# Patient Record
Sex: Female | Born: 1975 | Race: White | Hispanic: No | Marital: Married | Smoking: Never smoker
Health system: Southern US, Community
[De-identification: ages and names within clinical notes are randomized; demographics above are authoritative.]

## PROBLEM LIST (undated history)

## (undated) DIAGNOSIS — G43909 Migraine, unspecified, not intractable, without status migrainosus: Secondary | ICD-10-CM

## (undated) DIAGNOSIS — I10 Essential (primary) hypertension: Secondary | ICD-10-CM

## (undated) DIAGNOSIS — E785 Hyperlipidemia, unspecified: Secondary | ICD-10-CM

## (undated) DIAGNOSIS — J45909 Unspecified asthma, uncomplicated: Secondary | ICD-10-CM

## (undated) DIAGNOSIS — J309 Allergic rhinitis, unspecified: Secondary | ICD-10-CM

## (undated) HISTORY — DX: Allergic rhinitis, unspecified: J30.9

## (undated) HISTORY — DX: Essential (primary) hypertension: I10

## (undated) HISTORY — DX: Unspecified asthma, uncomplicated: J45.909

## (undated) HISTORY — DX: Migraine, unspecified, not intractable, without status migrainosus: G43.909

## (undated) HISTORY — DX: Hyperlipidemia, unspecified: E78.5

---

## 2007-03-15 ENCOUNTER — Ambulatory Visit: Payer: Self-pay | Admitting: Internal Medicine

## 2007-10-15 ENCOUNTER — Ambulatory Visit: Payer: Self-pay | Admitting: Internal Medicine

## 2008-02-08 ENCOUNTER — Ambulatory Visit: Payer: Self-pay | Admitting: Internal Medicine

## 2008-06-14 ENCOUNTER — Ambulatory Visit: Payer: Self-pay | Admitting: Internal Medicine

## 2009-10-01 IMAGING — CR DG CHEST 2V
1 series · 2 of 2 positions shown · non-contrast
Comparison: none

REASON FOR EXAM: cough
COMMENTS:

PROCEDURE:     DXR - DXR CHEST PA (OR AP) AND LATERAL  - February 08, 2008  [DATE]
RESULT:     Comparison is made to the prior exam 10/15/2007. The lung field
remains clear. The heart, mediastinal and osseous structures are normal in
appearance. The chest again appears to the mild hyperexpanded.

[Series 1: view not recorded · 0.17mm/px · 2 of 2 slices shown]
[im 1/2]
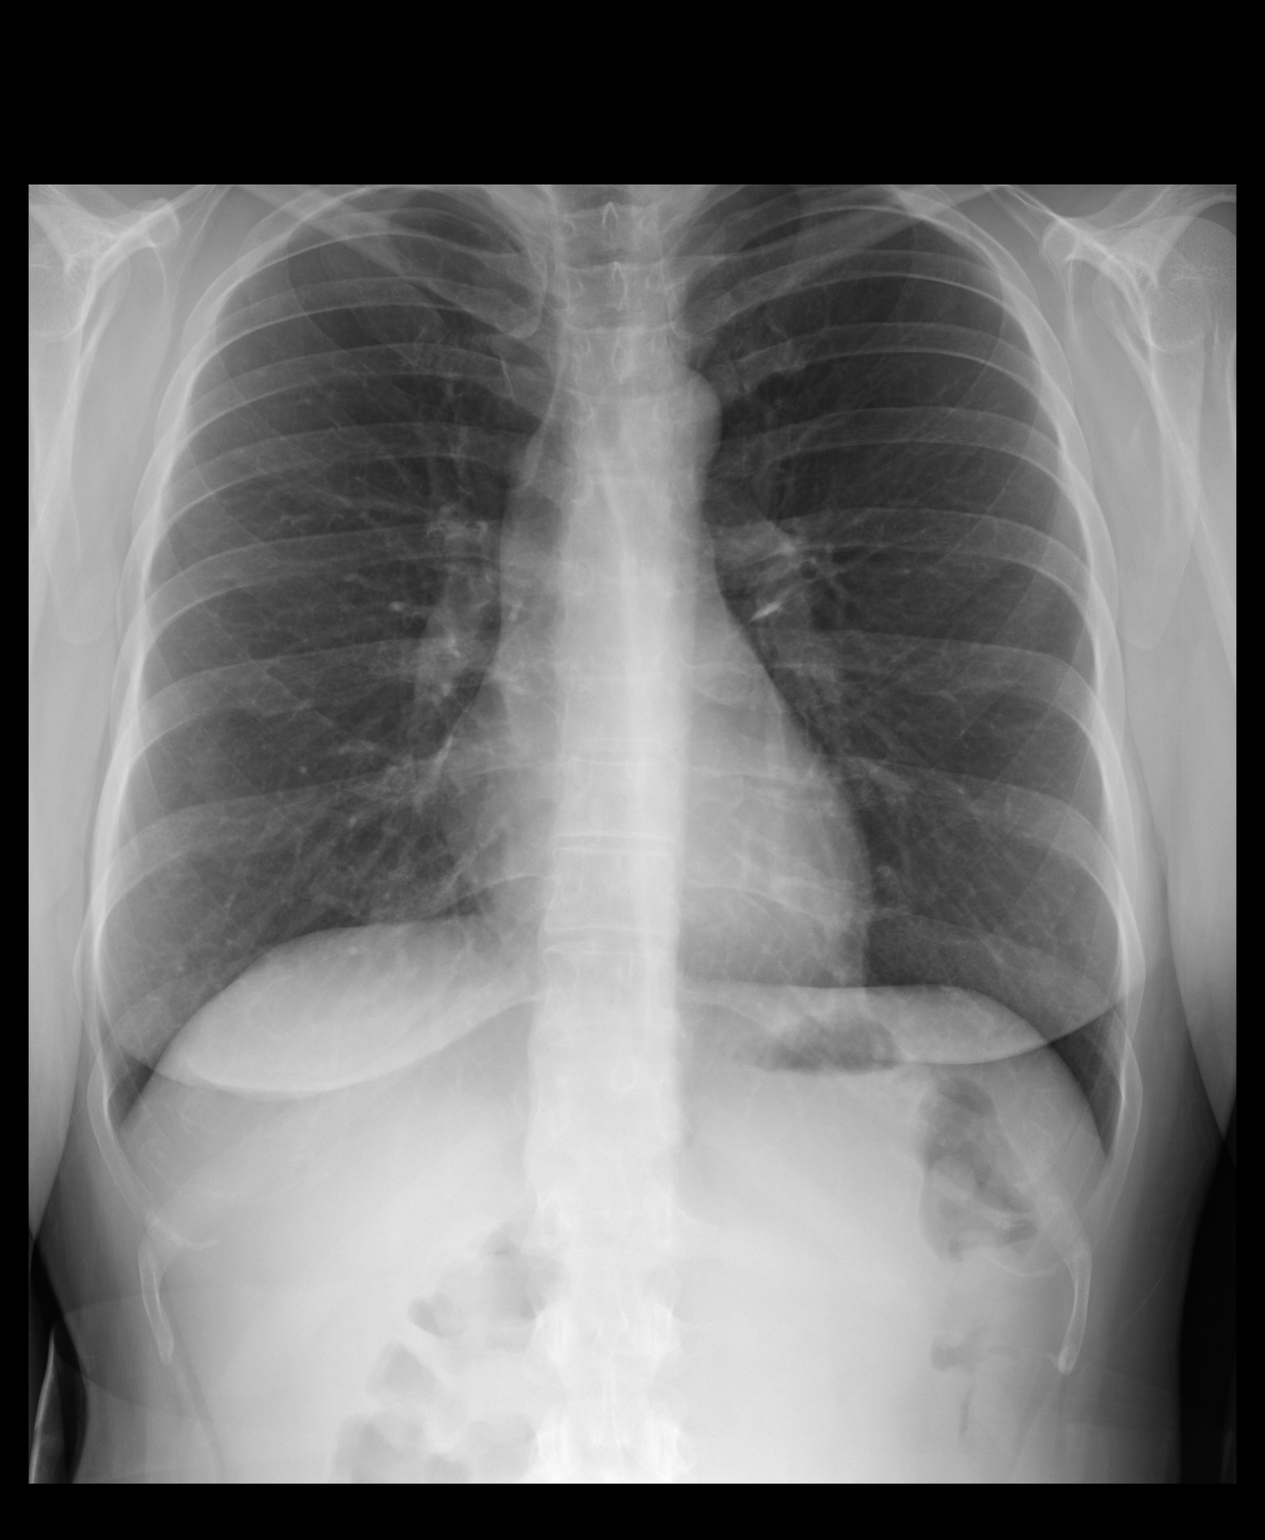
[im 2/2]
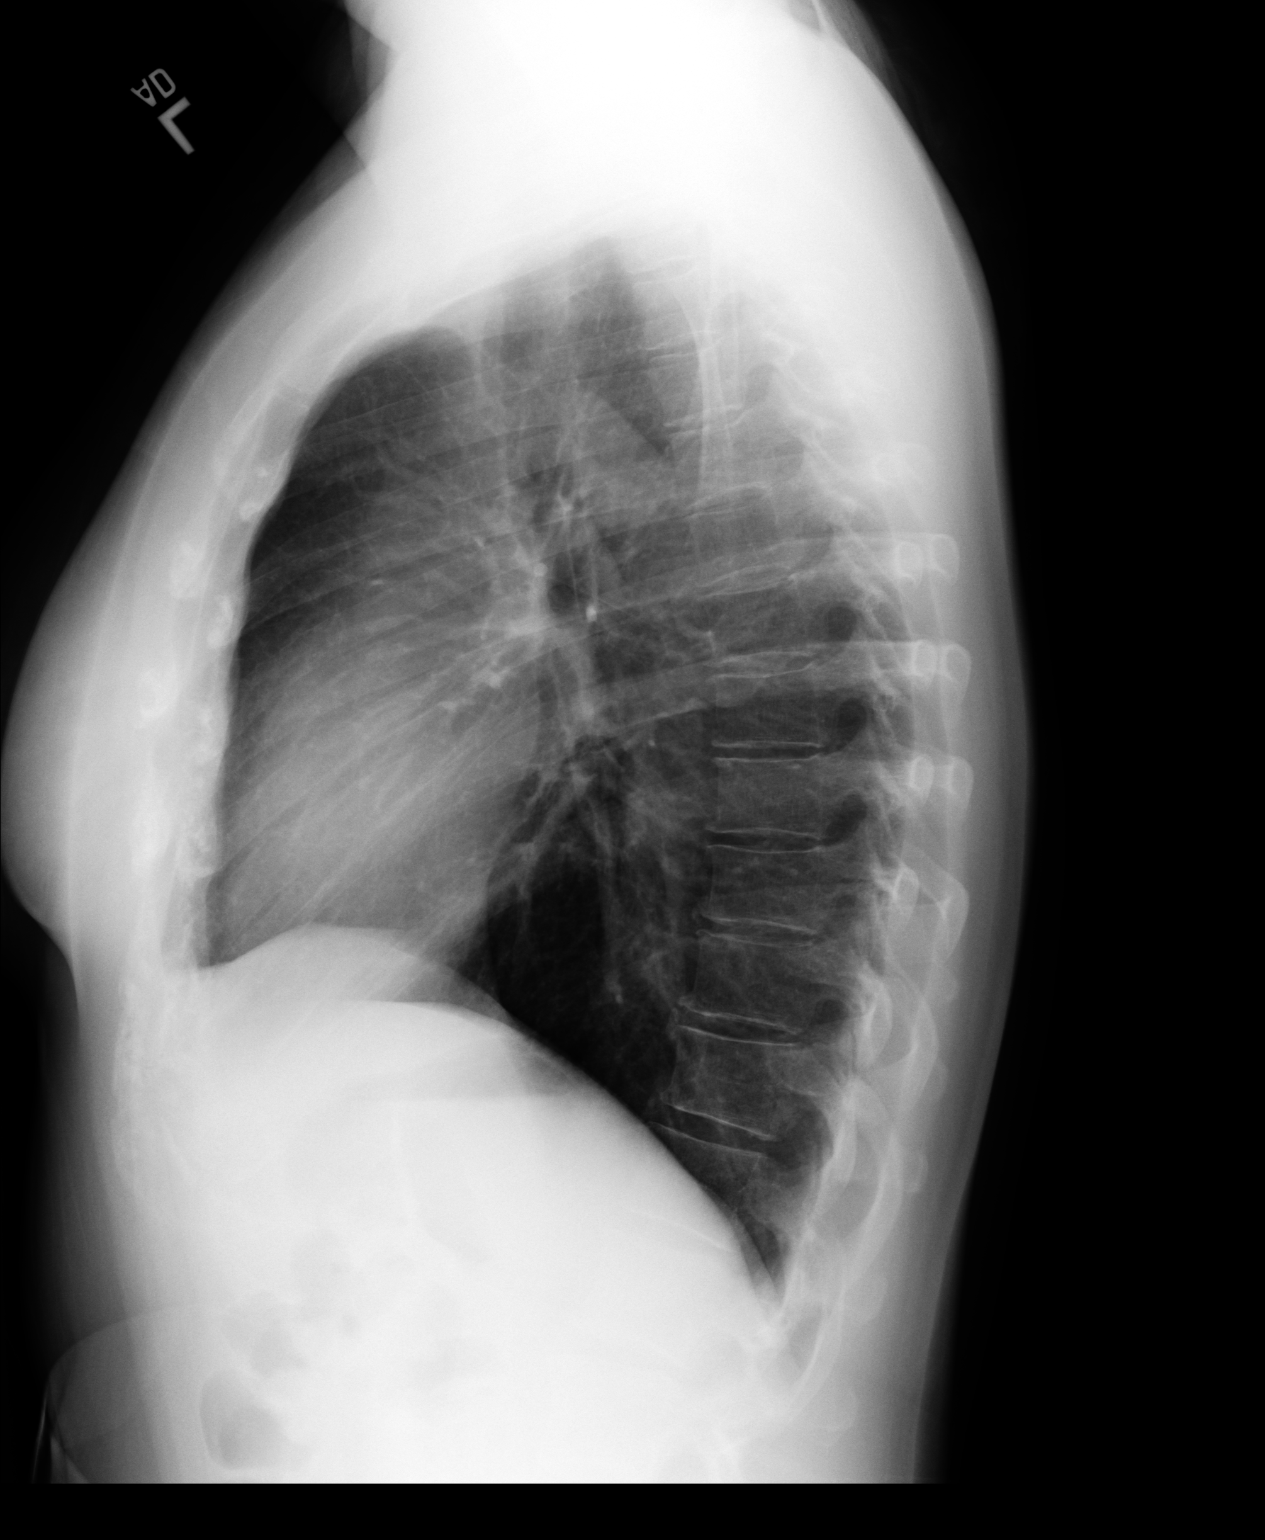

[2 of 2 positions shown; findings below may reference images not displayed]

IMPRESSION: 1. The lung fields are clear.
2. The chest appears mildly hyperexpanded.

## 2010-04-10 ENCOUNTER — Ambulatory Visit: Payer: Self-pay | Admitting: Internal Medicine

## 2010-04-24 ENCOUNTER — Ambulatory Visit: Payer: Self-pay | Admitting: Urology

## 2011-09-24 ENCOUNTER — Emergency Department: Payer: Self-pay | Admitting: *Deleted

## 2011-09-24 LAB — URINALYSIS, COMPLETE
Glucose,UR: NEGATIVE mg/dL (ref 0–75)
Ketone: NEGATIVE
Specific Gravity: 1.008 (ref 1.003–1.030)

## 2011-09-24 LAB — CBC
HGB: 12 g/dL (ref 12.0–16.0)
MCH: 30.8 pg (ref 26.0–34.0)
MCHC: 33.7 g/dL (ref 32.0–36.0)
MCV: 91 fL (ref 80–100)
Platelet: 248 10*3/uL (ref 150–440)
RDW: 14.1 % (ref 11.5–14.5)
WBC: 5.9 10*3/uL (ref 3.6–11.0)

## 2011-09-24 LAB — PREGNANCY, URINE: Pregnancy Test, Urine: NEGATIVE m[IU]/mL

## 2011-09-24 LAB — COMPREHENSIVE METABOLIC PANEL
Alkaline Phosphatase: 62 U/L (ref 50–136)
Anion Gap: 9 (ref 7–16)
Calcium, Total: 8.9 mg/dL (ref 8.5–10.1)
Chloride: 107 mmol/L (ref 98–107)
Co2: 26 mmol/L (ref 21–32)
EGFR (African American): >60
EGFR (Non-African Amer.): >60
Potassium: 3.6 mmol/L (ref 3.5–5.1)
Sodium: 142 mmol/L (ref 136–145)
Total Protein: 7.4 g/dL (ref 6.4–8.2)

## 2011-12-02 IMAGING — CR DG CHEST 2V
1 series · 2 of 2 positions shown · non-contrast
Comparison: none

REASON FOR EXAM: cough
COMMENTS:

PROCEDURE:     DXR - DXR CHEST PA (OR AP) AND LATERAL  - April 10, 2010 [DATE]
RESULT:     Comparison: None

[Series 1: view not recorded · 0.17mm/px · 2 of 2 slices shown]
[im 1/2]
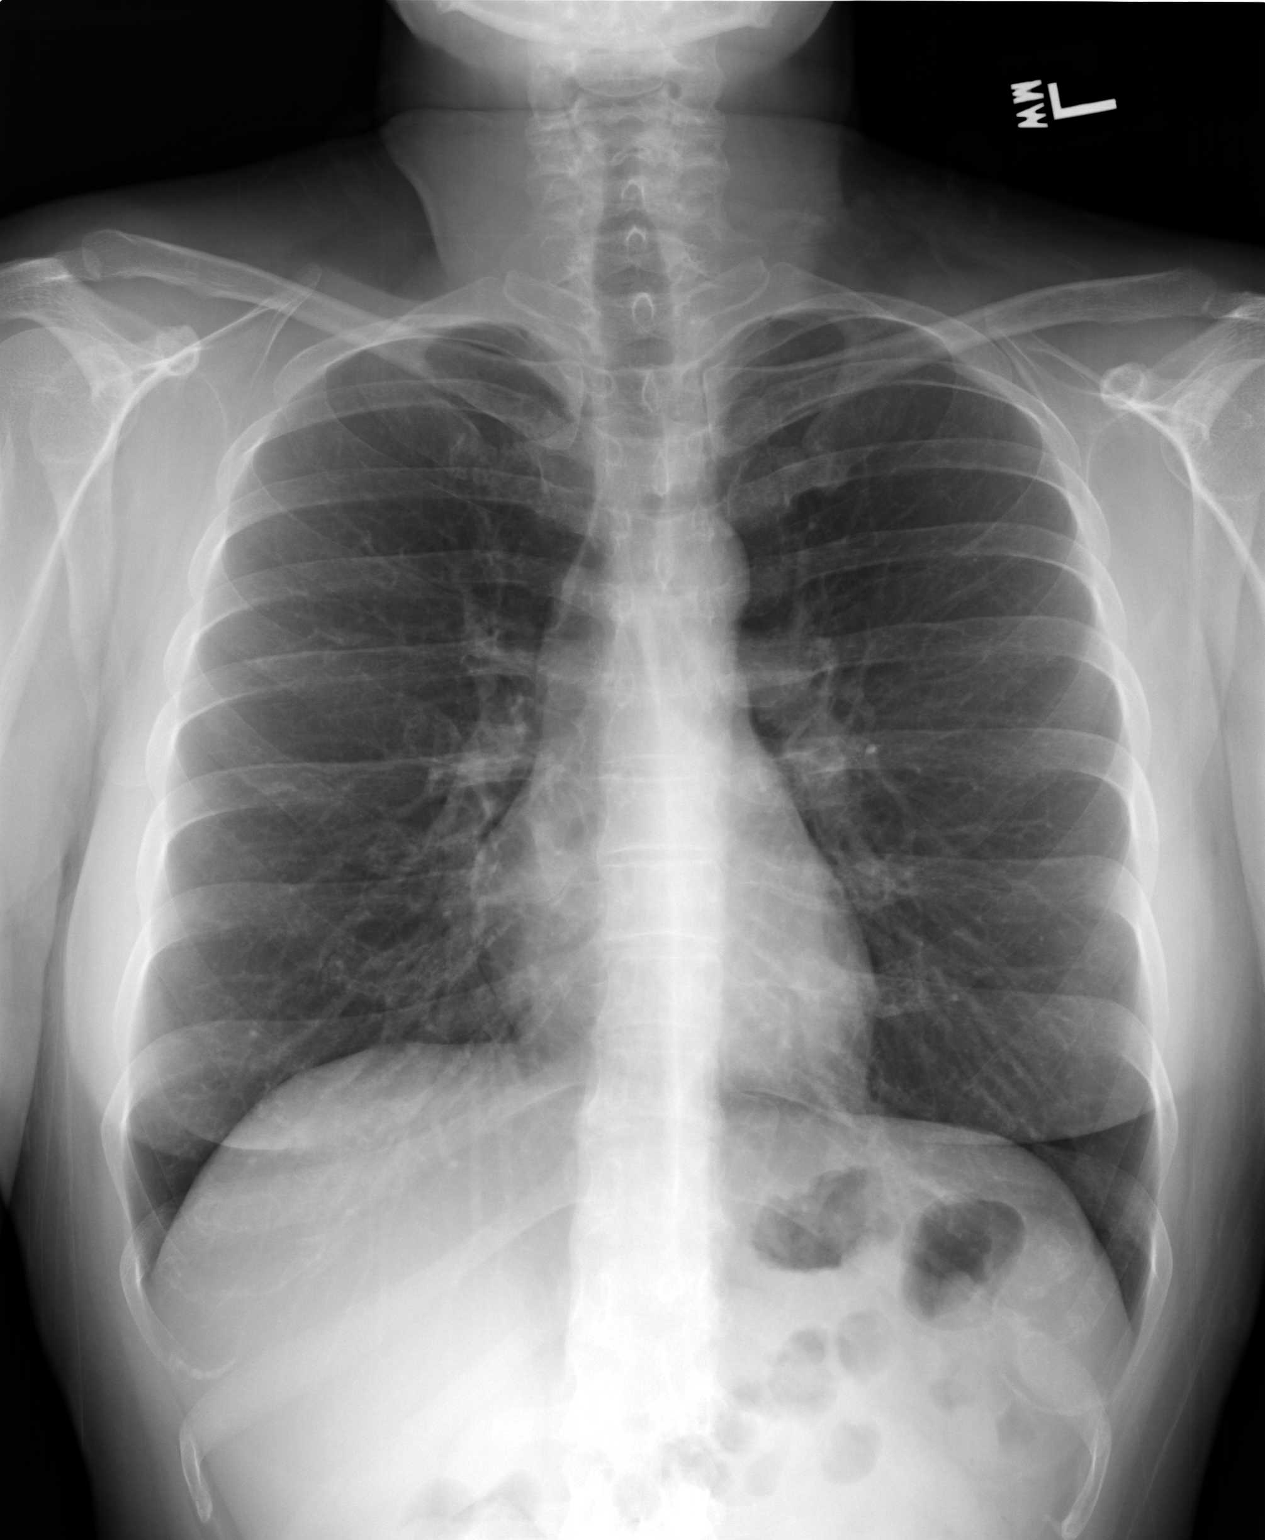
[im 2/2]
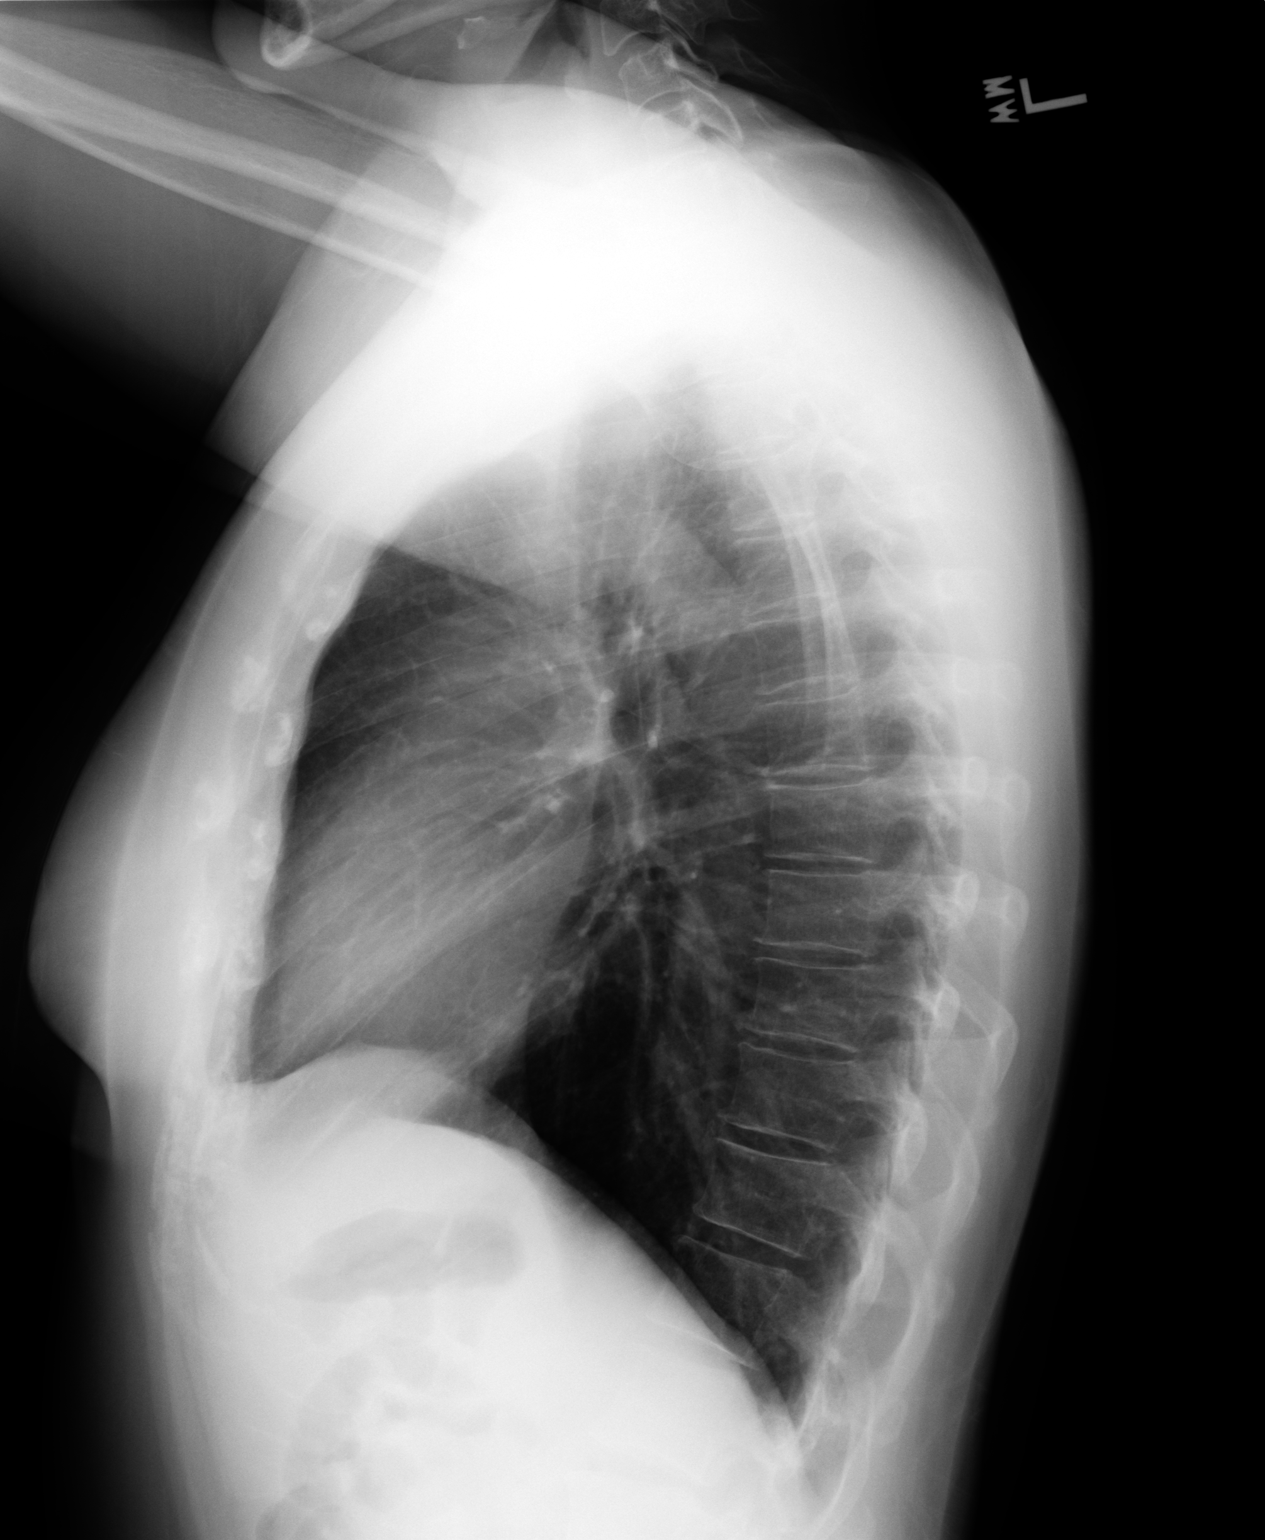

[2 of 2 positions shown; findings below may reference images not displayed]

FINDINGS: PA and lateral chest radiographs are provided.  There is no focal
parenchymal opacity, pleural effusion, or pneumothorax. The heart and
mediastinum are unremarkable. The osseous structures are unremarkable.
IMPRESSION: No acute disease of the chest.

## 2011-12-17 ENCOUNTER — Other Ambulatory Visit: Payer: Self-pay | Admitting: Obstetrics and Gynecology

## 2011-12-17 DIAGNOSIS — N6320 Unspecified lump in the left breast, unspecified quadrant: Secondary | ICD-10-CM

## 2011-12-23 ENCOUNTER — Other Ambulatory Visit: Payer: Self-pay | Admitting: Obstetrics and Gynecology

## 2011-12-23 ENCOUNTER — Ambulatory Visit
Admission: RE | Admit: 2011-12-23 | Discharge: 2011-12-23 | Disposition: A | Discharge status: Home / Self Care | Payer: BC Managed Care – PPO | Source: Ambulatory Visit | Attending: Obstetrics and Gynecology | Admitting: Obstetrics and Gynecology

## 2011-12-23 DIAGNOSIS — N6320 Unspecified lump in the left breast, unspecified quadrant: Secondary | ICD-10-CM

## 2014-04-12 ENCOUNTER — Ambulatory Visit: Payer: Self-pay

## 2017-04-03 ENCOUNTER — Other Ambulatory Visit: Payer: Self-pay

## 2017-04-07 ENCOUNTER — Other Ambulatory Visit: Payer: Self-pay

## 2017-04-09 ENCOUNTER — Other Ambulatory Visit: Payer: Self-pay

## 2017-04-09 MED ORDER — CYCLOBENZAPRINE HCL 10 MG PO TABS: 10.0000 mg | ORAL_TABLET | Freq: Three times a day (TID) | ORAL | 1 refills | Status: AC | PRN

## 2017-05-01 ENCOUNTER — Other Ambulatory Visit: Payer: Self-pay

## 2017-05-01 MED ORDER — IBUPROFEN 800 MG PO TABS: 800.0000 mg | ORAL_TABLET | Freq: Three times a day (TID) | ORAL | 1 refills | Status: DC | PRN

## 2017-05-18 ENCOUNTER — Other Ambulatory Visit: Payer: Self-pay

## 2017-05-18 MED ORDER — PROMETHAZINE HCL 25 MG PO TABS: ORAL_TABLET | ORAL | 1 refills | Status: DC

## 2017-06-19 ENCOUNTER — Other Ambulatory Visit: Payer: Self-pay | Admitting: Internal Medicine

## 2017-07-08 ENCOUNTER — Other Ambulatory Visit: Payer: Self-pay

## 2017-07-08 MED ORDER — IBUPROFEN 800 MG PO TABS: 800.0000 mg | ORAL_TABLET | Freq: Three times a day (TID) | ORAL | 1 refills | Status: DC | PRN

## 2017-07-19 ENCOUNTER — Other Ambulatory Visit: Payer: Self-pay | Admitting: Internal Medicine

## 2017-07-22 ENCOUNTER — Other Ambulatory Visit: Payer: Self-pay

## 2017-07-22 MED ORDER — MONTELUKAST SODIUM 10 MG PO TABS: 10.0000 mg | ORAL_TABLET | Freq: Every day | ORAL | 3 refills | Status: DC

## 2017-07-22 MED ORDER — PROMETHAZINE HCL 25 MG PO TABS: ORAL_TABLET | ORAL | 1 refills | Status: DC

## 2017-08-10 ENCOUNTER — Other Ambulatory Visit: Payer: Self-pay

## 2017-08-10 MED ORDER — IBUPROFEN 800 MG PO TABS: 800.0000 mg | ORAL_TABLET | Freq: Three times a day (TID) | ORAL | 1 refills | Status: DC | PRN

## 2017-08-11 ENCOUNTER — Other Ambulatory Visit: Payer: Self-pay

## 2017-08-11 MED ORDER — CYCLOBENZAPRINE HCL 10 MG PO TABS: 10.0000 mg | ORAL_TABLET | Freq: Three times a day (TID) | ORAL | 1 refills | Status: DC | PRN

## 2017-08-25 ENCOUNTER — Other Ambulatory Visit: Payer: Self-pay | Admitting: Internal Medicine

## 2017-09-21 ENCOUNTER — Other Ambulatory Visit: Payer: Self-pay

## 2017-09-21 ENCOUNTER — Telehealth: Payer: Self-pay

## 2017-09-21 MED ORDER — PROMETHAZINE HCL 25 MG PO TABS: ORAL_TABLET | ORAL | 1 refills | Status: DC

## 2017-09-21 NOTE — Telephone Encounter (Signed)
Try to call  Pt need appt for further voicemail full

## 2017-10-23 ENCOUNTER — Telehealth: Payer: Self-pay

## 2017-10-23 NOTE — Telephone Encounter (Signed)
PT MED REFILL WAS DENIED BECAUSE PT NEED APPT. PT WAS CALLED TO BE NOTIFIED SEVERAL TIMES.

## 2017-11-03 ENCOUNTER — Other Ambulatory Visit: Payer: Self-pay | Admitting: Internal Medicine

## 2017-11-04 ENCOUNTER — Ambulatory Visit: Payer: Self-pay | Admitting: Adult Health

## 2017-11-06 ENCOUNTER — Ambulatory Visit: Payer: Self-pay | Admitting: Adult Health

## 2017-11-09 ENCOUNTER — Encounter: Payer: Self-pay | Admitting: Nurse Practitioner

## 2017-11-09 ENCOUNTER — Ambulatory Visit: Payer: BC Managed Care – PPO | Admitting: Nurse Practitioner

## 2017-11-09 VITALS — BP 115/81 | HR 78 | Resp 16 | Ht 62.0 in | Wt 152.0 lb

## 2017-11-09 DIAGNOSIS — E876 Hypokalemia: Secondary | ICD-10-CM | POA: Diagnosis not present

## 2017-11-09 DIAGNOSIS — J4520 Mild intermittent asthma, uncomplicated: Secondary | ICD-10-CM | POA: Insufficient documentation

## 2017-11-09 DIAGNOSIS — J301 Allergic rhinitis due to pollen: Secondary | ICD-10-CM | POA: Diagnosis not present

## 2017-11-09 DIAGNOSIS — E782 Mixed hyperlipidemia: Secondary | ICD-10-CM | POA: Diagnosis not present

## 2017-11-09 DIAGNOSIS — I10 Essential (primary) hypertension: Secondary | ICD-10-CM

## 2017-11-09 DIAGNOSIS — G43909 Migraine, unspecified, not intractable, without status migrainosus: Secondary | ICD-10-CM | POA: Insufficient documentation

## 2017-11-09 DIAGNOSIS — R110 Nausea: Secondary | ICD-10-CM | POA: Diagnosis not present

## 2017-11-09 DIAGNOSIS — M791 Myalgia, unspecified site: Secondary | ICD-10-CM | POA: Diagnosis not present

## 2017-11-09 MED ORDER — IBUPROFEN 800 MG PO TABS: 800.0000 mg | ORAL_TABLET | Freq: Three times a day (TID) | ORAL | 1 refills | Status: DC | PRN

## 2017-11-09 MED ORDER — CYCLOBENZAPRINE HCL 10 MG PO TABS: 10.0000 mg | ORAL_TABLET | Freq: Three times a day (TID) | ORAL | 1 refills | Status: DC | PRN

## 2017-11-09 MED ORDER — LISINOPRIL 40 MG PO TABS: 40.0000 mg | ORAL_TABLET | Freq: Every day | ORAL | 5 refills | Status: DC

## 2017-11-09 MED ORDER — ALBUTEROL SULFATE HFA 108 (90 BASE) MCG/ACT IN AERS: 2.0000 | INHALATION_SPRAY | Freq: Four times a day (QID) | RESPIRATORY_TRACT | 5 refills | Status: DC | PRN

## 2017-11-09 MED ORDER — MONTELUKAST SODIUM 10 MG PO TABS: 10.0000 mg | ORAL_TABLET | Freq: Every day | ORAL | 3 refills | Status: DC

## 2017-11-09 MED ORDER — ATORVASTATIN CALCIUM 10 MG PO TABS: 10.0000 mg | ORAL_TABLET | ORAL | 5 refills | Status: DC

## 2017-11-09 MED ORDER — POTASSIUM CHLORIDE CRYS ER 10 MEQ PO TBCR: 10.0000 meq | EXTENDED_RELEASE_TABLET | Freq: Every day | ORAL | 5 refills | Status: DC

## 2017-11-09 MED ORDER — ZOLMITRIPTAN 5 MG PO TABS: 5.0000 mg | ORAL_TABLET | ORAL | 5 refills | Status: DC | PRN

## 2017-11-09 MED ORDER — PROMETHAZINE HCL 25 MG PO TABS: ORAL_TABLET | ORAL | 1 refills | Status: DC

## 2017-11-09 MED ORDER — MOMETASONE FUROATE 50 MCG/ACT NA SUSP: 2.0000 | Freq: Every day | NASAL | 5 refills | Status: DC

## 2017-11-09 NOTE — Progress Notes (Signed)
[facility] [address] [place], [place] [address]  Internal MEDICINE  Office Visit Note  Patient Name: Linda Rocha  [id number]  [id number]  Date of Service: 11/09/2017  Chief Complaint  Patient presents with  . Medication Refill  . Wheezing    couple of wks when the weather became hot  . Shortness of Breath    She continues to have migraine headaches. Will generally have 3 to 4 per week. Will take zomig to relieve the acute headache. This has continued to work well for her. She gets nauseated and has neck pain when she gets migraines. She often needs to take promethazine to relieve nausea and flexeril at night if she has neck pain and stiffness. She does need to have refills for these medications today.   Wheezing   This is a new problem. The current episode started more than 1 month ago. The problem occurs 2 to 4 times per day. The problem has been waxing and waning. Associated symptoms include headaches, neck pain and shortness of breath. Pertinent negatives include no abdominal pain, chest pain, chills, coughing, diarrhea, rash, rhinorrhea, sore throat or vomiting. The symptoms are aggravated by exercise and weather changes (hot and humid weather). She has tried leukotriene antagonists and rest for the symptoms. The treatment provided mild relief. Her past medical history is significant for asthma.       Current Medication: Outpatient Encounter Medications as of 11/09/2017  Medication Sig  . ALPRAZolam (XANAX) 1 MG tablet TK 1 T PO TID PRN  . amphetamine-dextroamphetamine (ADDERALL) 30 MG tablet TK 1 T PO BID  . atorvastatin (LIPITOR) 10 MG tablet Take 1 tablet (10 mg total) by mouth See admin instructions.  Marland Kitchen buPROPion (WELLBUTRIN SR) 100 MG 12 hr tablet Take 100 mg by mouth daily.  Marland Kitchen buPROPion (WELLBUTRIN SR) 150 MG 12 hr tablet Take 150 mg by mouth 2 (two) times daily.  . cyclobenzaprine (FLEXERIL) 10 MG tablet Take 1 tablet (10 mg total) by mouth 3 (three)  times daily as needed for muscle spasms.  Marland Kitchen ibuprofen (ADVIL,MOTRIN) 800 MG tablet 1 tablet (800 mg total) by Per NG tube route 3 (three) times daily as needed.  Marland Kitchen lisinopril (PRINIVIL,ZESTRIL) 40 MG tablet Take 1 tablet (40 mg total) by mouth daily.  . mometasone (NASONEX) 50 MCG/ACT nasal spray Place 2 sprays into the nose daily.  . montelukast (SINGULAIR) 10 MG tablet Take 1 tablet (10 mg total) by mouth daily.  . potassium chloride (KLOR-CON M10) 10 MEQ tablet Take 1 tablet (10 mEq total) by mouth daily.  . promethazine (PHENERGAN) 25 MG tablet Take 1 tablet by mouth as needed for nausea  . zolmitriptan (ZOMIG) 5 MG tablet Take 1 tablet (5 mg total) by mouth as needed for migraine.  . [DISCONTINUED] atorvastatin (LIPITOR) 10 MG tablet Take 10 mg by mouth See admin instructions.  . [DISCONTINUED] cyclobenzaprine (FLEXERIL) 10 MG tablet Take 1 tablet (10 mg total) by mouth 3 (three) times daily as needed for muscle spasms.  . [DISCONTINUED] ibuprofen (ADVIL,MOTRIN) 800 MG tablet 1 tablet (800 mg total) by Per NG tube route 3 (three) times daily as needed.  . [DISCONTINUED] KLOR-CON M10 10 MEQ tablet TAKE 1 TABLET BY MOUTH EVERY DAY  . [DISCONTINUED] lisinopril (PRINIVIL,ZESTRIL) 40 MG tablet TAKE 1 TABLET(S) BY MOUTH DAILY FOR HYPERTENSION  . [DISCONTINUED] mometasone (NASONEX) 50 MCG/ACT nasal spray SPRAY 2 SPRAYS IN EACH NOSTRIL DAILY FOR RHINITIS  . [DISCONTINUED] montelukast (SINGULAIR) 10 MG tablet Take  1 tablet (10 mg total) by mouth daily.  . [DISCONTINUED] promethazine (PHENERGAN) 25 MG tablet Take 1 tablet by mouth as needed for nausea  . [DISCONTINUED] zolmitriptan (ZOMIG) 5 MG tablet TAKE 1 TABLET BY MOUTH ONE TIME FOR MIGRAINE. MAY REPEAT DOSE IN 2 HOURS FOR PERSISTENT MIGRAINE.  Marland Kitchen albuterol (PROVENTIL HFA;VENTOLIN HFA) 108 (90 Base) MCG/ACT inhaler Inhale 2 puffs into the lungs every 6 (six) hours as needed for wheezing or shortness of breath.   No facility-administered encounter  medications on file as of 11/09/2017.     Surgical History: History reviewed. No pertinent surgical history.  Medical History: Past Medical History:  Diagnosis Date  . Allergic rhinitis   . Asthma   . Hyperlipidemia   . Hypertension   . Migraines     Family History: Family History  Problem Relation Age of Onset  . Alcohol abuse Mother   . Asthma Mother   . Coronary artery disease Mother   . Hypertension Mother   . Migraines Mother   . Alcohol abuse Father   . Asthma Father   . Hypertension Father   . Diabetes Maternal Grandmother   . Diabetes Maternal Grandfather   . Diabetes Paternal Grandmother   . Diabetes Paternal Grandfather     Social History   Socioeconomic History  . Marital status: Married    Spouse name: Not on file  . Number of children: Not on file  . Years of education: Not on file  . Highest education level: Not on file  Occupational History  . Not on file  Social Needs  . Financial resource strain: Not on file  . Food insecurity:    Worry: Not on file    Inability: Not on file  . Transportation needs:    Medical: Not on file    Non-medical: Not on file  Tobacco Use  . Smoking status: Never Smoker  . Smokeless tobacco: Never Used  Substance and Sexual Activity  . Alcohol use: Never    Frequency: Never  . Drug use: Not on file  . Sexual activity: Not on file  Lifestyle  . Physical activity:    Days per week: Not on file    Minutes per session: Not on file  . Stress: Not on file  Relationships  . Social connections:    Talks on phone: Not on file    Gets together: Not on file    Attends religious service: Not on file    Active member of club or organization: Not on file    Attends meetings of clubs or organizations: Not on file    Relationship status: Not on file  . Intimate partner violence:    Fear of current or ex partner: Not on file    Emotionally abused: Not on file    Physically abused: Not on file    Forced sexual  activity: Not on file  Other Topics Concern  . Not on file  Social History Narrative  . Not on file      Review of Systems  Constitutional: Negative for activity change, chills, fatigue and unexpected weight change.  HENT: Negative for congestion, postnasal drip, rhinorrhea, sneezing and sore throat.   Eyes: Negative.  Negative for redness.  Respiratory: Positive for shortness of breath and wheezing. Negative for cough and chest tightness.        Intermittent and worse with exercise and with hot and humid weather .  Cardiovascular: Negative for chest pain and palpitations.  Gastrointestinal: Negative  for abdominal pain, constipation, diarrhea, nausea and vomiting.  Endocrine: Negative for cold intolerance, heat intolerance, polydipsia, polyphagia and polyuria.  Genitourinary: Negative for dysuria and frequency.  Musculoskeletal: Positive for neck pain. Negative for arthralgias, back pain and joint swelling.  Skin: Negative for rash.  Allergic/Immunologic: Positive for environmental allergies.  Neurological: Positive for headaches. Negative for tremors and numbness.  Hematological: Negative for adenopathy. Does not bruise/bleed easily.  Psychiatric/Behavioral: Positive for dysphoric mood. Negative for behavioral problems (Depression), sleep disturbance and suicidal ideas. The patient is nervous/anxious.        Patient routinely ses psychiatry for anxiety and focus issues.     Today's Vitals   11/09/17 1042  BP: 115/81  Pulse: 78  Resp: 16  SpO2: 96%  Weight: 152 lb (68.9 kg)  Height: 5\' 2"  (1.575 m)    Physical Exam  Constitutional: She is oriented to person, place, and time. She appears well-developed and well-nourished. No distress.  HENT:  Head: Normocephalic and atraumatic.  Mouth/Throat: Oropharynx is clear and moist. No oropharyngeal exudate.  Eyes: Pupils are equal, round, and reactive to light. EOM are normal.  Neck: Normal range of motion. Neck supple. No  hepatojugular reflux and no JVD present. No tracheal deviation present. No thyromegaly present.  Cardiovascular: Normal rate, regular rhythm, normal heart sounds and intact distal pulses. Exam reveals no gallop and no friction rub.  No murmur heard. Pulmonary/Chest: Effort normal and breath sounds normal. No respiratory distress. She has no wheezes. She has no rales. She exhibits no tenderness.  Abdominal: Soft. Bowel sounds are normal. There is no tenderness.  Musculoskeletal: Normal range of motion.  Lymphadenopathy:    She has no cervical adenopathy.  Neurological: She is alert and oriented to person, place, and time. No cranial nerve deficit.  Skin: Skin is warm and dry. She is not diaphoretic.  Psychiatric: Her speech is normal and behavior is normal. Judgment and thought content normal. Her mood appears anxious.  Nursing note and vitals reviewed.  Assessment/Plan:  1. Mild intermittent asthma without complication Start albuterol inhaler. Use 2 puffs every 6 hours as needed. If persistent, will get PFT and echocardiogram.  - albuterol (PROVENTIL HFA;VENTOLIN HFA) 108 (90 Base) MCG/ACT inhaler; Inhale 2 puffs into the lungs every 6 (six) hours as needed for wheezing or shortness of breath.  Dispense: 1 Inhaler; Refill: 5  2. Seasonal allergic rhinitis due to pollen Continue singulair and nasonex nasal spray as prescribed. Refills provided today.  - montelukast (SINGULAIR) 10 MG tablet; Take 1 tablet (10 mg total) by mouth daily.  Dispense: 30 tablet; Refill: 3 - mometasone (NASONEX) 50 MCG/ACT nasal spray; Place 2 sprays into the nose daily.  Dispense: 17 g; Refill: 5  3. Migraine without status migrainosus, not intractable, unspecified migraine type - cyclobenzaprine (FLEXERIL) 10 MG tablet; Take 1 tablet (10 mg total) by mouth 3 (three) times daily as needed for muscle spasms.  Dispense: 90 tablet; Refill: 1 - ibuprofen (ADVIL,MOTRIN) 800 MG tablet; 1 tablet (800 mg total) by Per NG  tube route 3 (three) times daily as needed.  Dispense: 30 tablet; Refill: 1 - zolmitriptan (ZOMIG) 5 MG tablet; Take 1 tablet (5 mg total) by mouth as needed for migraine.  Dispense: 16 tablet; Refill: 5 - promethazine (PHENERGAN) 25 MG tablet; Take 1 tablet by mouth as needed for nausea  Dispense: 30 tablet; Refill: 1  4. Essential hypertension - lisinopril (PRINIVIL,ZESTRIL) 40 MG tablet; Take 1 tablet (40 mg total) by mouth daily.  Dispense:  30 tablet; Refill: 5  5. Myalgia - cyclobenzaprine (FLEXERIL) 10 MG tablet; Take 1 tablet (10 mg total) by mouth 3 (three) times daily as needed for muscle spasms.  Dispense: 90 tablet; Refill: 1  6. Mixed hyperlipidemia - atorvastatin (LIPITOR) 10 MG tablet; Take 1 tablet (10 mg total) by mouth See admin instructions.  Dispense: 30 tablet; Refill: 5  7. Nausea - promethazine (PHENERGAN) 25 MG tablet; Take 1 tablet by mouth as needed for nausea  Dispense: 30 tablet; Refill: 1  8. Hypokalemia - potassium chloride (KLOR-CON M10) 10 MEQ tablet; Take 1 tablet (10 mEq total) by mouth daily.  Dispense: 30 tablet; Refill: 5   General Counseling: Linda Rocha verbalizes understanding of the findings of todays visit and agrees with plan of treatment. I have discussed any further diagnostic evaluation that may be needed or ordered today. We also reviewed her medications today. she has been encouraged to call the office with any questions or concerns that should arise related to todays visit.  This patient was seen by Kholton Coate FNP Collaboration with Dr Lyndon CodeFozia M Khan as a part of [facility] care agreement   Meds ordered this encounter  Medications  . atorvastatin (LIPITOR) 10 MG tablet    Sig: Take 1 tablet (10 mg total) by mouth See admin instructions.    Dispense:  30 tablet    Refill:  5    Order Specific Question:   Supervising Provider    Answer:   Lyndon CodeKHAN, FOZIA M [[id number]]  . cyclobenzaprine (FLEXERIL) 10 MG tablet    Sig: Take 1 tablet (10 mg total) by  mouth 3 (three) times daily as needed for muscle spasms.    Dispense:  90 tablet    Refill:  1    Order Specific Question:   Supervising Provider    Answer:   Lyndon CodeKHAN, FOZIA M [[id number]]  . ibuprofen (ADVIL,MOTRIN) 800 MG tablet    Sig: 1 tablet (800 mg total) by Per NG tube route 3 (three) times daily as needed.    Dispense:  30 tablet    Refill:  1    Order Specific Question:   Supervising Provider    Answer:   Lyndon CodeKHAN, FOZIA M [[id number]]  . lisinopril (PRINIVIL,ZESTRIL) 40 MG tablet    Sig: Take 1 tablet (40 mg total) by mouth daily.    Dispense:  30 tablet    Refill:  5    Order Specific Question:   Supervising Provider    Answer:   Lyndon CodeKHAN, FOZIA M [[id number]]  . montelukast (SINGULAIR) 10 MG tablet    Sig: Take 1 tablet (10 mg total) by mouth daily.    Dispense:  30 tablet    Refill:  3    Order Specific Question:   Supervising Provider    Answer:   Lyndon CodeKHAN, FOZIA M [[id number]]  . zolmitriptan (ZOMIG) 5 MG tablet    Sig: Take 1 tablet (5 mg total) by mouth as needed for migraine.    Dispense:  16 tablet    Refill:  5    Order Specific Question:   Supervising Provider    Answer:   Lyndon CodeKHAN, FOZIA M [[id number]]  . mometasone (NASONEX) 50 MCG/ACT nasal spray    Sig: Place 2 sprays into the nose daily.    Dispense:  17 g    Refill:  5    Order Specific Question:   Supervising Provider    Answer:   Lyndon CodeKHAN, FOZIA M [[id number]]  . albuterol (PROVENTIL HFA;VENTOLIN HFA) 108 (  90 Base) MCG/ACT inhaler    Sig: Inhale 2 puffs into the lungs every 6 (six) hours as needed for wheezing or shortness of breath.    Dispense:  1 Inhaler    Refill:  5    Order Specific Question:   Supervising Provider    Answer:   Lyndon Code [[id number]]  . promethazine (PHENERGAN) 25 MG tablet    Sig: Take 1 tablet by mouth as needed for nausea    Dispense:  30 tablet    Refill:  1    Pt need appt for further refills    Order Specific Question:   Supervising Provider    Answer:   Lyndon Code [[id number]]  . potassium chloride (KLOR-CON M10) 10 MEQ  tablet    Sig: Take 1 tablet (10 mEq total) by mouth daily.    Dispense:  30 tablet    Refill:  5    Order Specific Question:   Supervising Provider    Answer:   Lyndon Code [[id number]]    Time spent: 96 Minutes      Dr Lyndon Code Internal medicine

## 2017-12-07 ENCOUNTER — Other Ambulatory Visit: Payer: Self-pay

## 2017-12-07 DIAGNOSIS — E876 Hypokalemia: Secondary | ICD-10-CM

## 2017-12-07 DIAGNOSIS — G43909 Migraine, unspecified, not intractable, without status migrainosus: Secondary | ICD-10-CM

## 2017-12-07 MED ORDER — POTASSIUM CHLORIDE CRYS ER 10 MEQ PO TBCR: 10.0000 meq | EXTENDED_RELEASE_TABLET | Freq: Every day | ORAL | 5 refills | Status: DC

## 2017-12-07 MED ORDER — IBUPROFEN 800 MG PO TABS: 800.0000 mg | ORAL_TABLET | Freq: Three times a day (TID) | ORAL | 1 refills | Status: DC | PRN

## 2017-12-11 ENCOUNTER — Encounter: Payer: Self-pay | Admitting: Nurse Practitioner

## 2017-12-11 ENCOUNTER — Telehealth: Payer: Self-pay

## 2017-12-11 ENCOUNTER — Ambulatory Visit: Payer: BC Managed Care – PPO | Admitting: Nurse Practitioner

## 2017-12-11 VITALS — BP 121/77 | HR 98 | Resp 16 | Ht 61.0 in | Wt 149.4 lb

## 2017-12-11 DIAGNOSIS — G43909 Migraine, unspecified, not intractable, without status migrainosus: Secondary | ICD-10-CM

## 2017-12-11 DIAGNOSIS — J0100 Acute maxillary sinusitis, unspecified: Secondary | ICD-10-CM

## 2017-12-11 DIAGNOSIS — J301 Allergic rhinitis due to pollen: Secondary | ICD-10-CM

## 2017-12-11 DIAGNOSIS — R110 Nausea: Secondary | ICD-10-CM

## 2017-12-11 DIAGNOSIS — J4520 Mild intermittent asthma, uncomplicated: Secondary | ICD-10-CM

## 2017-12-11 MED ORDER — CLARITHROMYCIN 500 MG PO TABS: 500.0000 mg | ORAL_TABLET | Freq: Two times a day (BID) | ORAL | 1 refills | Status: DC

## 2017-12-11 MED ORDER — PROMETHAZINE HCL 25 MG PO TABS
ORAL_TABLET | ORAL | 1 refills | Status: DC
Start: 2017-12-11 — End: 2018-02-01

## 2017-12-11 MED ORDER — CLARITHROMYCIN ER 500 MG PO TB24: 1000.0000 mg | ORAL_TABLET | Freq: Two times a day (BID) | ORAL | 0 refills | Status: DC

## 2017-12-11 MED ORDER — GALCANEZUMAB-GNLM 120 MG/ML ~~LOC~~ SOSY: 120.0000 mg | PREFILLED_SYRINGE | SUBCUTANEOUS | 5 refills | Status: DC

## 2017-12-11 MED ORDER — ZOLMITRIPTAN 5 MG NA SOLN: 1.0000 | NASAL | 0 refills | Status: DC | PRN

## 2017-12-11 NOTE — Progress Notes (Signed)
[facility] [address] [place], [place] [address]  Internal MEDICINE  Office Visit Note  Patient Name: Linda Rocha  [id number]  [id number]  Date of Service: 12/21/2017   Pt is here for a sick visit.  Chief Complaint  Patient presents with  . Sinusitis    yesterday got sick at school, vomiting and headache, took migraine medication it didnt help. pressure between eye and nasal area, teeth ache. has not vomited since last night has not eaten anything as well, past couple of night woke up with night sweats.  . Migraine     Sinusitis  This is a new problem. The current episode started yesterday. The problem has been gradually worsening since onset. There has been no fever. Her pain is at a severity of 2/10. The pain is mild. Associated symptoms include chills, congestion, headaches, neck pain, sinus pressure and a sore throat. Pertinent negatives include no coughing, ear pain, shortness of breath, sneezing or swollen glands. Past treatments include acetaminophen and oral decongestants. The treatment provided mild relief.  Migraine   This is a chronic problem. The current episode started more than 1 year ago. The problem occurs daily. The problem has been waxing and waning. The pain radiates to the left neck and right neck. The pain quality is similar to prior headaches. The quality of the pain is described as aching and sharp. The pain is at a severity of 5/10. The pain is moderate. Associated symptoms include nausea, neck pain, phonophobia, rhinorrhea, sinus pressure, a sore throat and vomiting. Pertinent negatives include no abdominal pain, back pain, coughing, ear pain, eye redness, fever, numbness or swollen glands. The symptoms are aggravated by noise, activity and emotional stress. She has tried antidepressants, beta blockers, triptans, NSAIDs, darkened room and cold packs for the symptoms. The treatment provided moderate relief. Her past medical history is significant  for migraine headaches and migraines in the family.        Current Medication:  Outpatient Encounter Medications as of 12/11/2017  Medication Sig  . albuterol (PROVENTIL HFA;VENTOLIN HFA) 108 (90 Base) MCG/ACT inhaler Inhale 2 puffs into the lungs every 6 (six) hours as needed for wheezing or shortness of breath.  Marland Kitchen atorvastatin (LIPITOR) 10 MG tablet Take 1 tablet (10 mg total) by mouth See admin instructions.  Marland Kitchen buPROPion (WELLBUTRIN SR) 100 MG 12 hr tablet Take 100 mg by mouth daily.  Marland Kitchen buPROPion (WELLBUTRIN SR) 150 MG 12 hr tablet Take 150 mg by mouth 2 (two) times daily.  . cyclobenzaprine (FLEXERIL) 10 MG tablet Take 1 tablet (10 mg total) by mouth 3 (three) times daily as needed for muscle spasms.  Marland Kitchen ibuprofen (ADVIL,MOTRIN) 800 MG tablet 1 tablet (800 mg total) by Per NG tube route 3 (three) times daily as needed.  Marland Kitchen lisinopril (PRINIVIL,ZESTRIL) 40 MG tablet Take 1 tablet (40 mg total) by mouth daily.  . mometasone (NASONEX) 50 MCG/ACT nasal spray Place 2 sprays into the nose daily.  . montelukast (SINGULAIR) 10 MG tablet Take 1 tablet (10 mg total) by mouth daily.  . potassium chloride (KLOR-CON M10) 10 MEQ tablet Take 1 tablet (10 mEq total) by mouth daily.  . promethazine (PHENERGAN) 25 MG tablet Take 1 tablet by mouth as needed for nausea  . zolmitriptan (ZOMIG) 5 MG tablet Take 1 tablet (5 mg total) by mouth as needed for migraine.  . [DISCONTINUED] promethazine (PHENERGAN) 25 MG tablet Take 1 tablet by mouth as needed for nausea  . ALPRAZolam Prudy Feeler) 1  MG tablet TK 1 T PO TID PRN  . amphetamine-dextroamphetamine (ADDERALL) 30 MG tablet TK 1 T PO BID  . clarithromycin (BIAXIN XL) 500 MG 24 hr tablet Take 2 tablets (1,000 mg total) by mouth 2 (two) times daily.  . Galcanezumab-gnlm (EMGALITY) 120 MG/ML SOSY Inject 120 mg into the skin every 30 (thirty) days.  Marland Kitchen zolmitriptan (ZOMIG) 5 MG nasal solution Place 1 spray into the nose as needed for migraine.  . [DISCONTINUED]  clarithromycin (BIAXIN) 500 MG tablet Take 1 tablet (500 mg total) by mouth 2 (two) times daily.   No facility-administered encounter medications on file as of 12/11/2017.       Medical History: Past Medical History:  Diagnosis Date  . Allergic rhinitis   . Asthma   . Hyperlipidemia   . Hypertension   . Migraines      Vital Signs: BP 121/77 (BP Location: Right Arm, Patient Position: Sitting, Cuff Size: Normal)   Pulse 98   Resp 16   Ht 5\' 1"  (1.549 m)   Wt 149 lb 6.4 oz (67.8 kg)   SpO2 98%   BMI 28.23 kg/m    Review of Systems  Constitutional: Positive for chills and fatigue. Negative for activity change, fever and unexpected weight change.  HENT: Positive for congestion, postnasal drip, rhinorrhea, sinus pressure, sinus pain and sore throat. Negative for ear pain and sneezing.   Eyes: Negative.  Negative for redness.  Respiratory: Negative for cough, chest tightness, shortness of breath and wheezing.   Cardiovascular: Negative for chest pain and palpitations.  Gastrointestinal: Positive for nausea and vomiting. Negative for abdominal pain, constipation and diarrhea.  Endocrine: Negative for cold intolerance, heat intolerance, polydipsia, polyphagia and polyuria.  Genitourinary: Negative.  Negative for dysuria and frequency.  Musculoskeletal: Positive for myalgias and neck pain. Negative for arthralgias, back pain and joint swelling.  Skin: Negative for rash.  Allergic/Immunologic: Positive for environmental allergies.  Neurological: Positive for headaches. Negative for tremors and numbness.  Hematological: Positive for adenopathy. Does not bruise/bleed easily.  Psychiatric/Behavioral: Positive for dysphoric mood. Negative for behavioral problems (Depression), sleep disturbance and suicidal ideas. The patient is nervous/anxious.        Patient routinely ses psychiatry for anxiety and focus issues.     Physical Exam  Constitutional: She is oriented to person, place, and  time. She appears well-developed and well-nourished. No distress.  HENT:  Head: Normocephalic and atraumatic.  Right Ear: Tympanic membrane is erythematous and bulging.  Left Ear: Tympanic membrane is erythematous and bulging.  Nose: Rhinorrhea present. Right sinus exhibits maxillary sinus tenderness and frontal sinus tenderness. Left sinus exhibits maxillary sinus tenderness and frontal sinus tenderness.  Mouth/Throat: Oropharynx is clear and moist. No oropharyngeal exudate.  Eyes: Pupils are equal, round, and reactive to light. EOM are normal.  Neck: Normal range of motion. Neck supple. No hepatojugular reflux and no JVD present. No tracheal deviation present. No thyromegaly present.  Cardiovascular: Normal rate, regular rhythm and normal heart sounds. Exam reveals no gallop and no friction rub.  No murmur heard. Pulmonary/Chest: Effort normal and breath sounds normal. No respiratory distress. She has no wheezes. She has no rales. She exhibits no tenderness.  Abdominal: Soft. Bowel sounds are normal. There is no tenderness.  Musculoskeletal: Normal range of motion.  Lymphadenopathy:    She has cervical adenopathy.  Neurological: She is alert and oriented to person, place, and time. No cranial nerve deficit.  Skin: Skin is warm and dry. She is not diaphoretic.  Psychiatric: Her speech is normal and behavior is normal. Judgment and thought content normal. Her mood appears anxious.  Nursing note and vitals reviewed.  Assessment/Plan: 1. Acute maxillary sinusitis, recurrence not specified Start biaxin XL twice daily for 14 days. Rest and increase fluids. Use OTC medication to relieve symptoms.  - clarithromycin (BIAXIN XL) 500 MG 24 hr tablet; Take 2 tablets (1,000 mg total) by mouth 2 (two) times daily.  Dispense: 28 tablet; Refill: 0  2. Migraine without status migrainosus, not intractable, unspecified migraine type Will start emgality 120mg  - instructions for loading dose and samples were  provided. May continue to use abortive therapy as needed and as prescribed. Will give trial of zomig nasal spray. Sample provided.  - promethazine (PHENERGAN) 25 MG tablet; Take 1 tablet by mouth as needed for nausea  Dispense: 30 tablet; Refill: 1 - Galcanezumab-gnlm (EMGALITY) 120 MG/ML SOSY; Inject 120 mg into the skin every 30 (thirty) days.  Dispense: 1 Syringe; Refill: 5 - zolmitriptan (ZOMIG) 5 MG nasal solution; Place 1 spray into the nose as needed for migraine.  Dispense: 6 Units; Refill: 0  3. Nausea May use promethazine as needed and as prescribed for acute nausea.  - promethazine (PHENERGAN) 25 MG tablet; Take 1 tablet by mouth as needed for nausea  Dispense: 30 tablet; Refill: 1  4. Mild intermittent asthma without complication Use inhalers as needed and as prescribed.  5. Seasonal allergic rhinitis due to pollen Continue all allergy medication as prescribed.  General Counseling: Cecille AmsterdamAnn verbalizes understanding of the findings of todays visit and agrees with plan of treatment. I have discussed any further diagnostic evaluation that may be needed or ordered today. We also reviewed her medications today. she has been encouraged to call the office with any questions or concerns that should arise related to todays visit.    Counseling:  Rest and increase fluids. Continue using OTC medication to control symptoms.   This patient was seen by Vincent GrosHeather Elizaveta Mattice FNP Collaboration with Dr Lyndon CodeFozia M Khan as a part of collaborative care agreement    Meds ordered this encounter  Medications  . DISCONTD: clarithromycin (BIAXIN) 500 MG tablet    Sig: Take 1 tablet (500 mg total) by mouth 2 (two) times daily.    Dispense:  20 tablet    Refill:  1    Order Specific Question:   Supervising Provider    Answer:   Lyndon CodeKHAN, FOZIA M [[id number]]  . promethazine (PHENERGAN) 25 MG tablet    Sig: Take 1 tablet by mouth as needed for nausea    Dispense:  30 tablet    Refill:  1    Pt need appt for further  refills    Order Specific Question:   Supervising Provider    Answer:   Lyndon CodeKHAN, FOZIA M [[id number]]  . Galcanezumab-gnlm (EMGALITY) 120 MG/ML SOSY    Sig: Inject 120 mg into the skin every 30 (thirty) days.    Dispense:  1 Syringe    Refill:  5    Patient given sample for loading dose today as well as copay assistance card.    Order Specific Question:   Supervising Provider    Answer:   Lyndon CodeKHAN, FOZIA M [[id number]]  . zolmitriptan (ZOMIG) 5 MG nasal solution    Sig: Place 1 spray into the nose as needed for migraine.    Dispense:  6 Units    Refill:  0    Order Specific Question:   Supervising Provider    Answer:  KHAN, FOZIA M [[id number]]  . clarithromycin (BIAXIN XL) 500 MG 24 hr tablet    Sig: Take 2 tablets (1,000 mg total) by mouth 2 (two) times daily.    Dispense:  28 tablet    Refill:  0    Order Specific Question:   Supervising Provider    Answer:   Lyndon Code [[id number]]    Time spent: 25 Minutes

## 2017-12-11 NOTE — Telephone Encounter (Signed)
Pt advised we change antibiotic and send to phar

## 2017-12-11 NOTE — Telephone Encounter (Signed)
Changed this to ER 50mg  bid for 14 days and sent to her pharmacy.

## 2017-12-15 NOTE — Telephone Encounter (Signed)
Called pt to make sure she received her abx.

## 2017-12-21 DIAGNOSIS — J0100 Acute maxillary sinusitis, unspecified: Secondary | ICD-10-CM | POA: Insufficient documentation

## 2017-12-31 ENCOUNTER — Other Ambulatory Visit: Payer: Self-pay

## 2017-12-31 DIAGNOSIS — M791 Myalgia, unspecified site: Secondary | ICD-10-CM

## 2017-12-31 DIAGNOSIS — G43909 Migraine, unspecified, not intractable, without status migrainosus: Secondary | ICD-10-CM

## 2017-12-31 MED ORDER — CYCLOBENZAPRINE HCL 10 MG PO TABS: 10.0000 mg | ORAL_TABLET | Freq: Three times a day (TID) | ORAL | 1 refills | Status: DC | PRN

## 2017-12-31 MED ORDER — ZOLMITRIPTAN 5 MG NA SOLN: 1.0000 | NASAL | 0 refills | Status: DC | PRN

## 2017-12-31 MED ORDER — IBUPROFEN 800 MG PO TABS: 800.0000 mg | ORAL_TABLET | Freq: Three times a day (TID) | ORAL | 1 refills | Status: DC | PRN

## 2018-01-25 ENCOUNTER — Telehealth: Payer: Self-pay

## 2018-01-25 ENCOUNTER — Other Ambulatory Visit: Payer: Self-pay

## 2018-01-25 NOTE — Telephone Encounter (Signed)
lmom to call us back

## 2018-01-26 ENCOUNTER — Other Ambulatory Visit: Payer: Self-pay

## 2018-01-26 DIAGNOSIS — G43909 Migraine, unspecified, not intractable, without status migrainosus: Secondary | ICD-10-CM

## 2018-01-26 MED ORDER — ZOLMITRIPTAN 5 MG PO TABS: 5.0000 mg | ORAL_TABLET | ORAL | 1 refills | Status: DC | PRN

## 2018-02-01 ENCOUNTER — Other Ambulatory Visit: Payer: Self-pay

## 2018-02-01 ENCOUNTER — Ambulatory Visit: Payer: Self-pay | Admitting: Nurse Practitioner

## 2018-02-01 DIAGNOSIS — R110 Nausea: Secondary | ICD-10-CM

## 2018-02-01 DIAGNOSIS — G43909 Migraine, unspecified, not intractable, without status migrainosus: Secondary | ICD-10-CM

## 2018-02-01 MED ORDER — PROMETHAZINE HCL 25 MG PO TABS: ORAL_TABLET | ORAL | 1 refills | Status: DC

## 2018-02-01 MED ORDER — IBUPROFEN 800 MG PO TABS: 800.0000 mg | ORAL_TABLET | Freq: Three times a day (TID) | ORAL | 1 refills | Status: DC | PRN

## 2018-02-02 ENCOUNTER — Ambulatory Visit: Payer: Self-pay | Admitting: Nurse Practitioner

## 2018-02-17 ENCOUNTER — Other Ambulatory Visit: Payer: Self-pay | Admitting: Nurse Practitioner

## 2018-02-17 DIAGNOSIS — G43909 Migraine, unspecified, not intractable, without status migrainosus: Secondary | ICD-10-CM

## 2018-02-17 MED ORDER — ZOLMITRIPTAN 5 MG NA SOLN: 1.0000 | NASAL | 3 refills | Status: DC | PRN

## 2018-02-17 NOTE — Progress Notes (Signed)
Renewed rx for zomig nasal spray to use as needed for acute migraines. Sent per pharmacy request.

## 2018-02-24 ENCOUNTER — Other Ambulatory Visit: Payer: Self-pay | Admitting: Nurse Practitioner

## 2018-02-24 DIAGNOSIS — J301 Allergic rhinitis due to pollen: Secondary | ICD-10-CM

## 2018-03-01 ENCOUNTER — Other Ambulatory Visit: Payer: Self-pay | Admitting: Nurse Practitioner

## 2018-03-01 DIAGNOSIS — G43909 Migraine, unspecified, not intractable, without status migrainosus: Secondary | ICD-10-CM

## 2018-03-01 DIAGNOSIS — M791 Myalgia, unspecified site: Secondary | ICD-10-CM

## 2018-03-08 ENCOUNTER — Other Ambulatory Visit: Payer: Self-pay

## 2018-03-08 DIAGNOSIS — G43909 Migraine, unspecified, not intractable, without status migrainosus: Secondary | ICD-10-CM

## 2018-03-08 MED ORDER — IBUPROFEN 800 MG PO TABS: 800.0000 mg | ORAL_TABLET | Freq: Three times a day (TID) | ORAL | 1 refills | Status: DC | PRN

## 2018-03-16 ENCOUNTER — Other Ambulatory Visit: Payer: Self-pay

## 2018-03-16 DIAGNOSIS — G43909 Migraine, unspecified, not intractable, without status migrainosus: Secondary | ICD-10-CM

## 2018-03-16 MED ORDER — ZOLMITRIPTAN 5 MG PO TABS: 5.0000 mg | ORAL_TABLET | ORAL | 1 refills | Status: DC | PRN

## 2018-03-25 ENCOUNTER — Telehealth: Payer: Self-pay | Admitting: Nurse Practitioner

## 2018-03-25 ENCOUNTER — Telehealth: Payer: Self-pay

## 2018-03-25 NOTE — Telephone Encounter (Signed)
PT CALLED AND SAID EMGALITY WAS NOT APPROVED BY INSURANCE. SPOKE WITH JODY AND IT WAS APPROVED TODAY. PT WAS NOTIFIED OF THIS.

## 2018-03-25 NOTE — Telephone Encounter (Signed)
Prior authorization for Linda Rocha has been approved good through 03/25/18-03/26/2019 Pharmacy notified left message on voicemail

## 2018-03-30 ENCOUNTER — Other Ambulatory Visit: Payer: Self-pay

## 2018-03-30 DIAGNOSIS — R110 Nausea: Secondary | ICD-10-CM

## 2018-03-30 DIAGNOSIS — G43909 Migraine, unspecified, not intractable, without status migrainosus: Secondary | ICD-10-CM

## 2018-03-30 MED ORDER — PROMETHAZINE HCL 25 MG PO TABS: ORAL_TABLET | ORAL | 1 refills | Status: DC

## 2018-04-16 ENCOUNTER — Other Ambulatory Visit: Payer: Self-pay

## 2018-04-16 DIAGNOSIS — I10 Essential (primary) hypertension: Secondary | ICD-10-CM

## 2018-04-16 MED ORDER — LISINOPRIL 40 MG PO TABS: 40.0000 mg | ORAL_TABLET | Freq: Every day | ORAL | 1 refills | Status: DC

## 2018-04-27 ENCOUNTER — Other Ambulatory Visit: Payer: Self-pay

## 2018-04-27 DIAGNOSIS — M791 Myalgia, unspecified site: Secondary | ICD-10-CM

## 2018-04-27 DIAGNOSIS — G43909 Migraine, unspecified, not intractable, without status migrainosus: Secondary | ICD-10-CM

## 2018-04-27 MED ORDER — CYCLOBENZAPRINE HCL 10 MG PO TABS: ORAL_TABLET | ORAL | 0 refills | Status: DC

## 2018-04-27 MED ORDER — IBUPROFEN 800 MG PO TABS: 800.0000 mg | ORAL_TABLET | Freq: Three times a day (TID) | ORAL | 1 refills | Status: DC | PRN

## 2018-04-30 ENCOUNTER — Telehealth: Payer: Self-pay

## 2018-04-30 NOTE — Telephone Encounter (Signed)
Pt called saying the pharmacy is saying that her medication will not be covered by insurance and she has to pay out of pocket for her zomig. Pt also stated they only gave her a few of the tablets at once rather than all 16 tabs. She went ahead and picked up 6 of her 16 tabs and paid out of pocket because she needed them.  I called [facility] on [address] for clarification and they notified me that pt has to pay out of pocket because her new insurance does not cover the medication until 05/09/2018. Pt picked up 6 tabs from one [facility] and is scheduled to pick up 2 from this [facility].   Spoke with Nimisha and she said that pharmacies do not allow patients to pick up zomig all at once, so pt will not receive all 16 tabs at once.    Spoke with pt and notified her that she does not receive all 16 tabs of her zomig because they do not give all tabs at once. And that pt will not have this medication covered until 05/09/2018 because new year of insurance.

## 2018-05-13 ENCOUNTER — Ambulatory Visit: Payer: BC Managed Care – PPO | Admitting: Nurse Practitioner

## 2018-05-13 ENCOUNTER — Encounter: Payer: Self-pay | Admitting: Nurse Practitioner

## 2018-05-13 VITALS — BP 133/88 | HR 101 | Resp 16 | Ht 61.25 in | Wt 170.2 lb

## 2018-05-13 DIAGNOSIS — M791 Myalgia, unspecified site: Secondary | ICD-10-CM

## 2018-05-13 DIAGNOSIS — G43909 Migraine, unspecified, not intractable, without status migrainosus: Secondary | ICD-10-CM

## 2018-05-13 DIAGNOSIS — J0100 Acute maxillary sinusitis, unspecified: Secondary | ICD-10-CM | POA: Diagnosis not present

## 2018-05-13 DIAGNOSIS — J4520 Mild intermittent asthma, uncomplicated: Secondary | ICD-10-CM | POA: Diagnosis not present

## 2018-05-13 DIAGNOSIS — R110 Nausea: Secondary | ICD-10-CM

## 2018-05-13 DIAGNOSIS — E782 Mixed hyperlipidemia: Secondary | ICD-10-CM

## 2018-05-13 DIAGNOSIS — J301 Allergic rhinitis due to pollen: Secondary | ICD-10-CM

## 2018-05-13 DIAGNOSIS — I10 Essential (primary) hypertension: Secondary | ICD-10-CM

## 2018-05-13 MED ORDER — MONTELUKAST SODIUM 10 MG PO TABS: 10.0000 mg | ORAL_TABLET | Freq: Every day | ORAL | 5 refills | Status: DC

## 2018-05-13 MED ORDER — ALBUTEROL SULFATE HFA 108 (90 BASE) MCG/ACT IN AERS: 2.0000 | INHALATION_SPRAY | Freq: Four times a day (QID) | RESPIRATORY_TRACT | 5 refills | Status: DC | PRN

## 2018-05-13 MED ORDER — MOMETASONE FUROATE 50 MCG/ACT NA SUSP: 2.0000 | Freq: Every day | NASAL | 5 refills | Status: DC

## 2018-05-13 MED ORDER — IBUPROFEN 800 MG PO TABS: 800.0000 mg | ORAL_TABLET | Freq: Three times a day (TID) | ORAL | 1 refills | Status: DC | PRN

## 2018-05-13 MED ORDER — PROMETHAZINE HCL 25 MG PO TABS: ORAL_TABLET | ORAL | 5 refills | Status: DC

## 2018-05-13 MED ORDER — GALCANEZUMAB-GNLM 120 MG/ML ~~LOC~~ SOSY: 120.0000 mg | PREFILLED_SYRINGE | SUBCUTANEOUS | 5 refills | Status: DC

## 2018-05-13 MED ORDER — ATORVASTATIN CALCIUM 10 MG PO TABS: 10.0000 mg | ORAL_TABLET | Freq: Every day | ORAL | 5 refills | Status: DC

## 2018-05-13 MED ORDER — CLARITHROMYCIN ER 500 MG PO TB24: 500.0000 mg | ORAL_TABLET | Freq: Two times a day (BID) | ORAL | 0 refills | Status: DC

## 2018-05-13 MED ORDER — CYCLOBENZAPRINE HCL 10 MG PO TABS: ORAL_TABLET | ORAL | 5 refills | Status: DC

## 2018-05-13 MED ORDER — LISINOPRIL 40 MG PO TABS: 40.0000 mg | ORAL_TABLET | Freq: Every day | ORAL | 1 refills | Status: DC

## 2018-05-13 MED ORDER — ZOLMITRIPTAN 5 MG PO TABS: 5.0000 mg | ORAL_TABLET | ORAL | 5 refills | Status: DC | PRN

## 2018-05-13 NOTE — Progress Notes (Signed)
[facility] [address] [place], [place] [address]  Internal MEDICINE  Office Visit Note  Patient Name: Linda Rocha  [id number] 09, 1977  [id number]  Date of Service: 05/23/2018  Chief Complaint  Patient presents with  . Medical Management of Chronic Issues    6 month follow up  . Sinusitis    pt is having sinus symptoms, pressure in between the eyes, has been going on for some months, no fever or chills    Patient her for routine follow up visit. She was started on Emgality injections to prevent migraine headaches at her last visit. She states that she is still getting migraines, however, frequency and severity of the headaches has improved. They typically go away with oral medications now, while, prior to starting emgality, they would take multiple treatments.   Sinusitis  This is a new problem. The current episode started yesterday. The problem has been gradually worsening since onset. There has been no fever. Her pain is at a severity of 2/10. The pain is mild. Associated symptoms include chills, congestion, headaches, neck pain, sinus pressure and a sore throat. Pertinent negatives include no coughing, ear pain, shortness of breath, sneezing or swollen glands. Past treatments include acetaminophen and oral decongestants. The treatment provided mild relief.  Migraine   This is a chronic problem. The current episode started more than 1 year ago. The problem occurs daily. The problem has been waxing and waning. The pain radiates to the left neck and right neck. The pain quality is similar to prior headaches. The quality of the pain is described as aching and sharp. The pain is at a severity of 5/10. The pain is moderate. Associated symptoms include nausea, neck pain, phonophobia, rhinorrhea, sinus pressure, a sore throat and vomiting. Pertinent negatives include no abdominal pain, back pain, coughing, ear pain, fever, numbness or swollen glands. The symptoms are aggravated by noise,  activity and emotional stress. She has tried antidepressants, beta blockers, triptans, NSAIDs, darkened room and cold packs for the symptoms. The treatment provided moderate relief. Her past medical history is significant for migraine headaches and migraines in the family.       Current Medication: Outpatient Encounter Medications as of 05/13/2018  Medication Sig  . albuterol (PROVENTIL HFA;VENTOLIN HFA) 108 (90 Base) MCG/ACT inhaler Inhale 2 puffs into the lungs every 6 (six) hours as needed for wheezing or shortness of breath.  . ALPRAZolam (XANAX) 1 MG tablet TK 1 T PO TID PRN  . amphetamine-dextroamphetamine (ADDERALL) 30 MG tablet TK 1 T PO BID  . atorvastatin (LIPITOR) 10 MG tablet Take 1 tablet (10 mg total) by mouth daily.  Marland Kitchen. buPROPion (WELLBUTRIN SR) 100 MG 12 hr tablet Take 100 mg by mouth daily.  Marland Kitchen. buPROPion (WELLBUTRIN SR) 150 MG 12 hr tablet Take 150 mg by mouth 2 (two) times daily.  . clarithromycin (BIAXIN XL) 500 MG 24 hr tablet Take 1 tablet (500 mg total) by mouth 2 (two) times daily.  . cyclobenzaprine (FLEXERIL) 10 MG tablet TAKE 1 TABLET BY MOUTH THREE TIMES A DAY AS NEEDED FOR MUSCLE SPASMS  . Galcanezumab-gnlm (EMGALITY) 120 MG/ML SOSY Inject 120 mg into the skin every 30 (thirty) days.  Marland Kitchen. ibuprofen (ADVIL,MOTRIN) 800 MG tablet 1 tablet (800 mg total) by Per NG tube route 3 (three) times daily as needed.  . mometasone (NASONEX) 50 MCG/ACT nasal spray Place 2 sprays into the nose daily.  . montelukast (SINGULAIR) 10 MG tablet Take 1 tablet (10 mg total) by mouth  daily.  . potassium chloride (KLOR-CON M10) 10 MEQ tablet Take 1 tablet (10 mEq total) by mouth daily.  . promethazine (PHENERGAN) 25 MG tablet Take 1 tablet by mouth as needed for nausea  . zolmitriptan (ZOMIG) 5 MG nasal solution Place 1 spray into the nose as needed for migraine.  . zolmitriptan (ZOMIG) 5 MG tablet Take 1 tablet (5 mg total) by mouth as needed for migraine.  . [DISCONTINUED] albuterol  (PROVENTIL HFA;VENTOLIN HFA) 108 (90 Base) MCG/ACT inhaler Inhale 2 puffs into the lungs every 6 (six) hours as needed for wheezing or shortness of breath.  . [DISCONTINUED] atorvastatin (LIPITOR) 10 MG tablet Take 1 tablet (10 mg total) by mouth See admin instructions.  . [DISCONTINUED] clarithromycin (BIAXIN XL) 500 MG 24 hr tablet Take 2 tablets (1,000 mg total) by mouth 2 (two) times daily.  . [DISCONTINUED] cyclobenzaprine (FLEXERIL) 10 MG tablet TAKE 1 TABLET BY MOUTH THREE TIMES A DAY AS NEEDED FOR MUSCLE SPASMS  . [DISCONTINUED] Galcanezumab-gnlm (EMGALITY) 120 MG/ML SOSY Inject 120 mg into the skin every 30 (thirty) days.  . [DISCONTINUED] ibuprofen (ADVIL,MOTRIN) 800 MG tablet 1 tablet (800 mg total) by Per NG tube route 3 (three) times daily as needed.  . [DISCONTINUED] lisinopril (PRINIVIL,ZESTRIL) 40 MG tablet Take 1 tablet (40 mg total) by mouth daily.  . [DISCONTINUED] lisinopril (PRINIVIL,ZESTRIL) 40 MG tablet Take 1 tablet (40 mg total) by mouth daily.  . [DISCONTINUED] mometasone (NASONEX) 50 MCG/ACT nasal spray Place 2 sprays into the nose daily.  . [DISCONTINUED] montelukast (SINGULAIR) 10 MG tablet TAKE 1 TABLET BY MOUTH EVERY DAY  . [DISCONTINUED] promethazine (PHENERGAN) 25 MG tablet Take 1 tablet by mouth as needed for nausea  . [DISCONTINUED] zolmitriptan (ZOMIG) 5 MG tablet Take 1 tablet (5 mg total) by mouth as needed for migraine.   No facility-administered encounter medications on file as of 05/13/2018.     Surgical History: History reviewed. No pertinent surgical history.  Medical History: Past Medical History:  Diagnosis Date  . Allergic rhinitis   . Asthma   . Hyperlipidemia   . Hypertension   . Migraines     Family History: Family History  Problem Relation Age of Onset  . Alcohol abuse Mother   . Asthma Mother   . Coronary artery disease Mother   . Hypertension Mother   . Migraines Mother   . Alcohol abuse Father   . Asthma Father   .  Hypertension Father   . Diabetes Maternal Grandmother   . Diabetes Maternal Grandfather   . Diabetes Paternal Grandmother   . Diabetes Paternal Grandfather     Social History   Socioeconomic History  . Marital status: Married    Spouse name: Not on file  . Number of children: Not on file  . Years of education: Not on file  . Highest education level: Not on file  Occupational History  . Not on file  Social Needs  . Financial resource strain: Not on file  . Food insecurity:    Worry: Not on file    Inability: Not on file  . Transportation needs:    Medical: Not on file    Non-medical: Not on file  Tobacco Use  . Smoking status: Never Smoker  . Smokeless tobacco: Never Used  Substance and Sexual Activity  . Alcohol use: Never    Frequency: Never  . Drug use: Not on file  . Sexual activity: Not on file  Lifestyle  . Physical activity:  Days per week: Not on file    Minutes per session: Not on file  . Stress: Not on file  Relationships  . Social connections:    Talks on phone: Not on file    Gets together: Not on file    Attends religious service: Not on file    Active member of club or organization: Not on file    Attends meetings of clubs or organizations: Not on file    Relationship status: Not on file  . Intimate partner violence:    Fear of current or ex partner: Not on file    Emotionally abused: Not on file    Physically abused: Not on file    Forced sexual activity: Not on file  Other Topics Concern  . Not on file  Social History Narrative  . Not on file      Review of Systems  Constitutional: Positive for chills and fatigue. Negative for fever.  HENT: Positive for congestion, postnasal drip, rhinorrhea, sinus pressure, sinus pain and sore throat. Negative for ear pain and sneezing.   Respiratory: Positive for wheezing. Negative for cough and shortness of breath.   Cardiovascular: Negative for chest pain and palpitations.  Gastrointestinal:  Positive for nausea and vomiting. Negative for abdominal pain.  Endocrine: Negative for cold intolerance, heat intolerance, polydipsia and polyuria.  Musculoskeletal: Positive for neck pain and neck stiffness. Negative for back pain.  Skin: Negative for rash.  Neurological: Positive for headaches. Negative for numbness.    Today's Vitals   05/13/18 1559  BP: 133/88  Pulse: (!) 101  Resp: 16  SpO2: 92%  Weight: 170 lb 3.2 oz (77.2 kg)  Height: 5' 1.25" (1.556 m)  Body mass index is 31.9 kg/m.  Physical Exam Vitals signs and nursing note reviewed.  Constitutional:      General: She is not in acute distress.    Appearance: Normal appearance. She is well-developed. She is not diaphoretic.  HENT:     Head: Normocephalic and atraumatic.     Right Ear: Tympanic membrane is erythematous and bulging.     Left Ear: Tympanic membrane is erythematous and bulging.     Nose: Rhinorrhea present.     Right Sinus: Maxillary sinus tenderness and frontal sinus tenderness present.     Left Sinus: Maxillary sinus tenderness and frontal sinus tenderness present.     Mouth/Throat:     Pharynx: No oropharyngeal exudate.  Eyes:     Pupils: Pupils are equal, round, and reactive to light.  Neck:     Musculoskeletal: Normal range of motion and neck supple.     Thyroid: No thyromegaly.     Vascular: No hepatojugular reflux or JVD.     Trachea: No tracheal deviation.  Cardiovascular:     Rate and Rhythm: Normal rate and regular rhythm.     Heart sounds: Normal heart sounds. No murmur. No friction rub. No gallop.   Pulmonary:     Effort: Pulmonary effort is normal. No respiratory distress.     Breath sounds: Normal breath sounds. No wheezing or rales.  Chest:     Chest wall: No tenderness.  Abdominal:     General: Bowel sounds are normal.     Palpations: Abdomen is soft.     Tenderness: There is no abdominal tenderness.  Musculoskeletal: Normal range of motion.  Lymphadenopathy:     Cervical:  Cervical adenopathy present.  Skin:    General: Skin is warm and dry.  Neurological:  General: No focal deficit present.     Mental Status: She is alert and oriented to person, place, and time.     Cranial Nerves: No cranial nerve deficit.  Psychiatric:        Mood and Affect: Mood is anxious.        Speech: Speech normal.        Behavior: Behavior normal.        Thought Content: Thought content normal.        Judgment: Judgment normal.   Assessment/Plan: 1. Acute maxillary sinusitis, recurrence not specified Start biaxin XL 500mg  twice daily for 14 days. Rest and increase fluids. Use OTC medication to improve acute symptoms.  - clarithromycin (BIAXIN XL) 500 MG 24 hr tablet; Take 1 tablet (500 mg total) by mouth 2 (two) times daily.  Dispense: 28 tablet; Refill: 0  2. Mild intermittent asthma without complication May use rescue inhaler as needed and as prescribed . - albuterol (PROVENTIL HFA;VENTOLIN HFA) 108 (90 Base) MCG/ACT inhaler; Inhale 2 puffs into the lungs every 6 (six) hours as needed for wheezing or shortness of breath.  Dispense: 1 Inhaler; Refill: 5  3. Mixed hyperlipidemia Continue atorvastatin as prescribed  - atorvastatin (LIPITOR) 10 MG tablet; Take 1 tablet (10 mg total) by mouth daily.  Dispense: 30 tablet; Refill: 5  4. Migraine without status migrainosus, not intractable, unspecified migraine type Continue emgality 120mg  monthly to lessen severity and frequency of migraine headaches. Use abortive therapy as needed and as prescribed  - cyclobenzaprine (FLEXERIL) 10 MG tablet; TAKE 1 TABLET BY MOUTH THREE TIMES A DAY AS NEEDED FOR MUSCLE SPASMS  Dispense: 90 tablet; Refill: 5 - Galcanezumab-gnlm (EMGALITY) 120 MG/ML SOSY; Inject 120 mg into the skin every 30 (thirty) days.  Dispense: 1 Syringe; Refill: 5 - ibuprofen (ADVIL,MOTRIN) 800 MG tablet; 1 tablet (800 mg total) by Per NG tube route 3 (three) times daily as needed.  Dispense: 30 tablet; Refill: 1 -  promethazine (PHENERGAN) 25 MG tablet; Take 1 tablet by mouth as needed for nausea  Dispense: 30 tablet; Refill: 5 - zolmitriptan (ZOMIG) 5 MG tablet; Take 1 tablet (5 mg total) by mouth as needed for migraine.  Dispense: 16 tablet; Refill: 5  5. Nausea Use promethazine as needed and as prescribed  - promethazine (PHENERGAN) 25 MG tablet; Take 1 tablet by mouth as needed for nausea  Dispense: 30 tablet; Refill: 5  6. Essential hypertension Stable. Continue bp medication as prescribed   7. Myalgia May take flexeril as needed and as prescribed.  - cyclobenzaprine (FLEXERIL) 10 MG tablet; TAKE 1 TABLET BY MOUTH THREE TIMES A DAY AS NEEDED FOR MUSCLE SPASMS  Dispense: 90 tablet; Refill: 5  8. Seasonal allergic rhinitis due to pollen Continue all allergy medication as prescribed  - mometasone (NASONEX) 50 MCG/ACT nasal spray; Place 2 sprays into the nose daily.  Dispense: 17 g; Refill: 5 - montelukast (SINGULAIR) 10 MG tablet; Take 1 tablet (10 mg total) by mouth daily.  Dispense: 30 tablet; Refill: 5   General Counseling: Roopa verbalizes understanding of the findings of todays visit and agrees with plan of treatment. I have discussed any further diagnostic evaluation that may be needed or ordered today. We also reviewed her medications today. she has been encouraged to call the office with any questions or concerns that should arise related to todays visit.  Hypertension Counseling:   The following hypertensive lifestyle modification were recommended and discussed:  1. Limiting alcohol intake to less than 1  oz/day of ethanol:(24 oz of beer or 8 oz of wine or 2 oz of 100-proof whiskey). 2. Take baby ASA 81 mg daily. 3. Importance of regular aerobic exercise and losing weight. 4. Reduce dietary saturated fat and cholesterol intake for overall cardiovascular health. 5. Maintaining adequate dietary potassium, calcium, and magnesium intake. 6. Regular monitoring of the blood pressure. 7. Reduce  sodium intake to less than 100 mmol/day (less than 2.3 gm of sodium or less than 6 gm of sodium choride)   This patient was seen by Vincent Gros FNP Collaboration with Dr Lyndon Code as a part of collaborative care agreement  Meds ordered this encounter  Medications  . albuterol (PROVENTIL HFA;VENTOLIN HFA) 108 (90 Base) MCG/ACT inhaler    Sig: Inhale 2 puffs into the lungs every 6 (six) hours as needed for wheezing or shortness of breath.    Dispense:  1 Inhaler    Refill:  5    Order Specific Question:   Supervising Provider    Answer:   Lyndon Code [[id number]]  . atorvastatin (LIPITOR) 10 MG tablet    Sig: Take 1 tablet (10 mg total) by mouth daily.    Dispense:  30 tablet    Refill:  5    Order Specific Question:   Supervising Provider    Answer:   Lyndon Code [[id number]]  . cyclobenzaprine (FLEXERIL) 10 MG tablet    Sig: TAKE 1 TABLET BY MOUTH THREE TIMES A DAY AS NEEDED FOR MUSCLE SPASMS    Dispense:  90 tablet    Refill:  5    Order Specific Question:   Supervising Provider    Answer:   Lyndon Code [[id number]]  . clarithromycin (BIAXIN XL) 500 MG 24 hr tablet    Sig: Take 1 tablet (500 mg total) by mouth 2 (two) times daily.    Dispense:  28 tablet    Refill:  0    Order Specific Question:   Supervising Provider    Answer:   Lyndon Code [[id number]]  . Galcanezumab-gnlm (EMGALITY) 120 MG/ML SOSY    Sig: Inject 120 mg into the skin every 30 (thirty) days.    Dispense:  1 Syringe    Refill:  5    Patient given sample for loading dose today as well as copay assistance card.    Order Specific Question:   Supervising Provider    Answer:   Lyndon Code [[id number]]  . ibuprofen (ADVIL,MOTRIN) 800 MG tablet    Sig: 1 tablet (800 mg total) by Per NG tube route 3 (three) times daily as needed.    Dispense:  30 tablet    Refill:  1    Order Specific Question:   Supervising Provider    Answer:   Lyndon Code [[id number]]  . DISCONTD: lisinopril (PRINIVIL,ZESTRIL) 40 MG tablet    Sig: Take 1  tablet (40 mg total) by mouth daily.    Dispense:  30 tablet    Refill:  1    Order Specific Question:   Supervising Provider    Answer:   Lyndon Code [[id number]]  . mometasone (NASONEX) 50 MCG/ACT nasal spray    Sig: Place 2 sprays into the nose daily.    Dispense:  17 g    Refill:  5    Order Specific Question:   Supervising Provider    Answer:   Lyndon Code [[id number]]  . montelukast (SINGULAIR) 10 MG tablet    Sig: Take  1 tablet (10 mg total) by mouth daily.    Dispense:  30 tablet    Refill:  5    Order Specific Question:   Supervising Provider    Answer:   Lyndon Code [[id number]]  . promethazine (PHENERGAN) 25 MG tablet    Sig: Take 1 tablet by mouth as needed for nausea    Dispense:  30 tablet    Refill:  5    Pt need appt for further refills    Order Specific Question:   Supervising Provider    Answer:   Lyndon Code [[id number]]  . zolmitriptan (ZOMIG) 5 MG tablet    Sig: Take 1 tablet (5 mg total) by mouth as needed for migraine.    Dispense:  16 tablet    Refill:  5    Patient has history of frequenct and persistent migraine headaches    Order Specific Question:   Supervising Provider    Answer:   Lyndon Code [[id number]]    Time spent: 26 Minutes      Dr Lyndon Code Internal medicine

## 2018-05-21 ENCOUNTER — Other Ambulatory Visit: Payer: Self-pay

## 2018-05-21 DIAGNOSIS — I10 Essential (primary) hypertension: Secondary | ICD-10-CM

## 2018-05-21 MED ORDER — LISINOPRIL 40 MG PO TABS: 40.0000 mg | ORAL_TABLET | Freq: Every day | ORAL | 1 refills | Status: DC

## 2018-06-19 ENCOUNTER — Other Ambulatory Visit: Payer: Self-pay | Admitting: Nurse Practitioner

## 2018-06-19 DIAGNOSIS — I10 Essential (primary) hypertension: Secondary | ICD-10-CM

## 2018-06-21 ENCOUNTER — Other Ambulatory Visit: Payer: Self-pay

## 2018-06-21 DIAGNOSIS — I10 Essential (primary) hypertension: Secondary | ICD-10-CM

## 2018-06-21 MED ORDER — LISINOPRIL 40 MG PO TABS: 40.0000 mg | ORAL_TABLET | Freq: Every day | ORAL | 1 refills | Status: DC

## 2018-06-23 ENCOUNTER — Other Ambulatory Visit: Payer: Self-pay

## 2018-07-26 ENCOUNTER — Other Ambulatory Visit: Payer: Self-pay

## 2018-07-26 ENCOUNTER — Other Ambulatory Visit: Payer: Self-pay | Admitting: Nurse Practitioner

## 2018-07-26 DIAGNOSIS — I10 Essential (primary) hypertension: Secondary | ICD-10-CM

## 2018-07-26 MED ORDER — LISINOPRIL 40 MG PO TABS: 40.0000 mg | ORAL_TABLET | Freq: Every day | ORAL | 0 refills | Status: DC

## 2018-07-26 MED ORDER — LISINOPRIL 40 MG PO TABS: 40.0000 mg | ORAL_TABLET | Freq: Every day | ORAL | 1 refills | Status: DC

## 2018-08-06 ENCOUNTER — Other Ambulatory Visit: Payer: Self-pay

## 2018-08-06 DIAGNOSIS — M791 Myalgia, unspecified site: Secondary | ICD-10-CM

## 2018-08-06 DIAGNOSIS — G43909 Migraine, unspecified, not intractable, without status migrainosus: Secondary | ICD-10-CM

## 2018-08-06 MED ORDER — CYCLOBENZAPRINE HCL 10 MG PO TABS: ORAL_TABLET | ORAL | 0 refills | Status: DC

## 2018-08-09 ENCOUNTER — Ambulatory Visit: Payer: BC Managed Care – PPO | Admitting: Adult Health

## 2018-08-09 ENCOUNTER — Encounter: Payer: Self-pay | Admitting: Adult Health

## 2018-08-09 ENCOUNTER — Other Ambulatory Visit: Payer: Self-pay

## 2018-08-09 VITALS — HR 101 | Temp 98.7°F | Ht 61.25 in | Wt 160.0 lb

## 2018-08-09 DIAGNOSIS — J4520 Mild intermittent asthma, uncomplicated: Secondary | ICD-10-CM

## 2018-08-09 DIAGNOSIS — I10 Essential (primary) hypertension: Secondary | ICD-10-CM

## 2018-08-09 DIAGNOSIS — J0110 Acute frontal sinusitis, unspecified: Secondary | ICD-10-CM

## 2018-08-09 MED ORDER — CLARITHROMYCIN ER 500 MG PO TB24: 500.0000 mg | ORAL_TABLET | Freq: Two times a day (BID) | ORAL | 0 refills | Status: DC

## 2018-08-09 NOTE — Progress Notes (Signed)
[facility] [address] [place], [place] [address]  Internal MEDICINE  Telephone Visit  Patient Name: Linda Rocha  [id number]  [id number]  Date of Service: 08/09/2018  I connected with the patient at 1045 by telephone and verified the patients identity using two identifiers.  I discussed the limitations, risks, security and privacy concerns of performing an evaluation and management service by telephone and the availability of in person appointments. I also discussed with the patient that there may be a patient responsible charge related to the service.  The patient expressed understanding and agrees to proceed.    Chief Complaint  Patient presents with  . Telephone Screen  . Sinusitis  . Wheezing  . Cough    produces mucous  . Telephone Assessment    HPI  Pt is seen for sinus pain and pressure as well as periorbital pain and pressure.  She has been coughing up some mucous.  She has been having PND as well.  All of these symptoms have been doing on for a few weeks.  She is taking singulair and allegra for allergies.  She has also been using mucinex. She denies any visual changes.  She denies any fever, sob or other symptoms.     Current Medication: Outpatient Encounter Medications as of 08/09/2018  Medication Sig  . albuterol (PROVENTIL HFA;VENTOLIN HFA) 108 (90 Base) MCG/ACT inhaler Inhale 2 puffs into the lungs every 6 (six) hours as needed for wheezing or shortness of breath.  . ALPRAZolam (XANAX) 1 MG tablet TK 1 T PO TID PRN  . amphetamine-dextroamphetamine (ADDERALL) 30 MG tablet TK 1 T PO BID  . atorvastatin (LIPITOR) 10 MG tablet Take 1 tablet (10 mg total) by mouth daily.  Marland Kitchen buPROPion (WELLBUTRIN SR) 100 MG 12 hr tablet Take 100 mg by mouth daily.  Marland Kitchen buPROPion (WELLBUTRIN SR) 150 MG 12 hr tablet Take 150 mg by mouth 2 (two) times daily.  . cyclobenzaprine (FLEXERIL) 10 MG tablet TAKE 1 TABLET BY MOUTH THREE TIMES A DAY AS NEEDED FOR MUSCLE SPASMS  .  Galcanezumab-gnlm (EMGALITY) 120 MG/ML SOSY Inject 120 mg into the skin every 30 (thirty) days.  Marland Kitchen ibuprofen (ADVIL,MOTRIN) 800 MG tablet 1 tablet (800 mg total) by Per NG tube route 3 (three) times daily as needed.  Marland Kitchen lisinopril (PRINIVIL,ZESTRIL) 40 MG tablet Take 1 tablet (40 mg total) by mouth daily.  . mometasone (NASONEX) 50 MCG/ACT nasal spray Place 2 sprays into the nose daily.  . montelukast (SINGULAIR) 10 MG tablet Take 1 tablet (10 mg total) by mouth daily.  . potassium chloride (KLOR-CON M10) 10 MEQ tablet Take 1 tablet (10 mEq total) by mouth daily.  . promethazine (PHENERGAN) 25 MG tablet Take 1 tablet by mouth as needed for nausea  . zolmitriptan (ZOMIG) 5 MG nasal solution Place 1 spray into the nose as needed for migraine.  . zolmitriptan (ZOMIG) 5 MG tablet Take 1 tablet (5 mg total) by mouth as needed for migraine.  . clarithromycin (BIAXIN XL) 500 MG 24 hr tablet Take 1 tablet (500 mg total) by mouth 2 (two) times daily.  . [DISCONTINUED] clarithromycin (BIAXIN XL) 500 MG 24 hr tablet Take 1 tablet (500 mg total) by mouth 2 (two) times daily. (Patient not taking: Reported on 08/09/2018)   No facility-administered encounter medications on file as of 08/09/2018.     Surgical History: History reviewed. No pertinent surgical history.  Medical History: Past Medical History:  Diagnosis Date  . Allergic rhinitis   .  Asthma   . Hyperlipidemia   . Hypertension   . Migraines     Family History: Family History  Problem Relation Age of Onset  . Alcohol abuse Mother   . Asthma Mother   . Coronary artery disease Mother   . Hypertension Mother   . Migraines Mother   . Alcohol abuse Father   . Asthma Father   . Hypertension Father   . Diabetes Maternal Grandmother   . Diabetes Maternal Grandfather   . Diabetes Paternal Grandmother   . Diabetes Paternal Grandfather     Social History   Socioeconomic History  . Marital status: Married    Spouse name: Not on file  .  Number of children: Not on file  . Years of education: Not on file  . Highest education level: Not on file  Occupational History  . Not on file  Social Needs  . Financial resource strain: Not on file  . Food insecurity:    Worry: Not on file    Inability: Not on file  . Transportation needs:    Medical: Not on file    Non-medical: Not on file  Tobacco Use  . Smoking status: Never Smoker  . Smokeless tobacco: Never Used  Substance and Sexual Activity  . Alcohol use: Never    Frequency: Never  . Drug use: Not on file  . Sexual activity: Not on file  Lifestyle  . Physical activity:    Days per week: Not on file    Minutes per session: Not on file  . Stress: Not on file  Relationships  . Social connections:    Talks on phone: Not on file    Gets together: Not on file    Attends religious service: Not on file    Active member of club or organization: Not on file    Attends meetings of clubs or organizations: Not on file    Relationship status: Not on file  . Intimate partner violence:    Fear of current or ex partner: Not on file    Emotionally abused: Not on file    Physically abused: Not on file    Forced sexual activity: Not on file  Other Topics Concern  . Not on file  Social History Narrative  . Not on file      Review of Systems  Constitutional: Negative for chills, fatigue and unexpected weight change.  HENT: Positive for facial swelling, postnasal drip, sinus pressure and sinus pain. Negative for congestion, rhinorrhea, sneezing and sore throat.   Eyes: Negative for photophobia, pain and redness.  Respiratory: Positive for cough. Negative for chest tightness and shortness of breath.   Cardiovascular: Negative for chest pain and palpitations.  Gastrointestinal: Negative for abdominal pain, constipation, diarrhea, nausea and vomiting.  Endocrine: Negative.   Genitourinary: Negative for dysuria and frequency.  Musculoskeletal: Negative for arthralgias, back  pain, joint swelling and neck pain.  Skin: Negative for rash.  Allergic/Immunologic: Negative.   Neurological: Negative for tremors and numbness.  Hematological: Negative for adenopathy. Does not bruise/bleed easily.  Psychiatric/Behavioral: Negative for behavioral problems and sleep disturbance. The patient is not nervous/anxious.     Vital Signs: Pulse (!) 101   Temp 98.7 F (37.1 C)   Ht 5' 1.25" (1.556 m)   Wt 160 lb (72.6 kg)   SpO2 96%   BMI 29.99 kg/m    Observation/Objective:  Appears well.  Speaking in full sentences.  Conversing with ease.    Assessment/Plan: 1. Acute  non-recurrent frontal sinusitis Advised patient to take entire course of antibiotics as prescribed with food. Pt should return to clinic in 7-10 days if symptoms fail to improve or new symptoms develop.  - clarithromycin (BIAXIN XL) 500 MG 24 hr tablet; Take 1 tablet (500 mg total) by mouth 2 (two) times daily.  Dispense: 28 tablet; Refill: 0  2. Mild intermittent asthma without complication [place], continue present therapy.  3. Essential hypertension Stable, continue present management.   General Counseling: Linda AmsterdamAnn verbalizes understanding of the findings of today's phone visit and agrees with plan of treatment. I have discussed any further diagnostic evaluation that may be needed or ordered today. We also reviewed her medications today. she has been encouraged to call the office with any questions or concerns that should arise related to todays visit.    No orders of the defined types were placed in this encounter.   Meds ordered this encounter  Medications  . clarithromycin (BIAXIN XL) 500 MG 24 hr tablet    Sig: Take 1 tablet (500 mg total) by mouth 2 (two) times daily.    Dispense:  28 tablet    Refill:  0    Time spent: 11 Minutes    Blima LedgerAdam Bethannie Iglehart AGNP-C Internal medicine

## 2018-10-20 ENCOUNTER — Other Ambulatory Visit: Payer: Self-pay

## 2018-10-20 DIAGNOSIS — I10 Essential (primary) hypertension: Secondary | ICD-10-CM

## 2018-10-20 DIAGNOSIS — G43909 Migraine, unspecified, not intractable, without status migrainosus: Secondary | ICD-10-CM

## 2018-10-20 MED ORDER — LISINOPRIL 40 MG PO TABS: 40.0000 mg | ORAL_TABLET | Freq: Every day | ORAL | 0 refills | Status: DC

## 2018-10-20 MED ORDER — IBUPROFEN 800 MG PO TABS: 800.0000 mg | ORAL_TABLET | Freq: Three times a day (TID) | ORAL | 1 refills | Status: DC | PRN

## 2018-11-16 ENCOUNTER — Other Ambulatory Visit: Payer: Self-pay

## 2018-11-16 DIAGNOSIS — G43909 Migraine, unspecified, not intractable, without status migrainosus: Secondary | ICD-10-CM

## 2018-11-16 MED ORDER — ZOMIG 5 MG NA SOLN: 1.0000 | NASAL | 3 refills | Status: DC | PRN

## 2018-11-18 ENCOUNTER — Ambulatory Visit: Payer: BC Managed Care – PPO | Admitting: Nurse Practitioner

## 2018-11-18 ENCOUNTER — Other Ambulatory Visit: Payer: Self-pay

## 2018-11-18 ENCOUNTER — Encounter: Payer: Self-pay | Admitting: Nurse Practitioner

## 2018-11-18 VITALS — HR 95

## 2018-11-18 DIAGNOSIS — J4520 Mild intermittent asthma, uncomplicated: Secondary | ICD-10-CM

## 2018-11-18 DIAGNOSIS — G43909 Migraine, unspecified, not intractable, without status migrainosus: Secondary | ICD-10-CM | POA: Diagnosis not present

## 2018-11-18 DIAGNOSIS — I10 Essential (primary) hypertension: Secondary | ICD-10-CM

## 2018-11-18 DIAGNOSIS — J301 Allergic rhinitis due to pollen: Secondary | ICD-10-CM | POA: Diagnosis not present

## 2018-11-18 NOTE — Progress Notes (Signed)
[facility] [address] [place], [place] [address]  Internal MEDICINE  Telephone Visit  Patient Name: Linda Rocha  [id number]  [id number]  Date of Service: 12/01/2018  I connected with the patient at 5:19pm by webcam and verified the patients identity using two identifiers.   I discussed the limitations, risks, security and privacy concerns of performing an evaluation and management service by webcam and the availability of in person appointments. I also discussed with the patient that there may be a patient responsible charge related to the service.  The patient expressed understanding and agrees to proceed.    Chief Complaint  Patient presents with  . Medical Management of Chronic Issues  . Medication Management    stopped taking for a month due to weight gain, no rebound headaches  . Hyperlipidemia  . Hypertension  . Migraine  . Allergic Rhinitis   . Asthma    The patient has been contacted via webcam for follow up visit due to concerns for spread of novel coronavirus. Patient her for routine follow up visit. She continues to take Emgality injections to prevent migraine headaches. She states that she is still getting migraines, however, frequency and severity of the headaches has improved. They typically go away with oral medications now, while, prior to starting emgality, they would take multiple treatments.        Current Medication: Outpatient Encounter Medications as of 11/18/2018  Medication Sig  . albuterol (PROVENTIL HFA;VENTOLIN HFA) 108 (90 Base) MCG/ACT inhaler Inhale 2 puffs into the lungs every 6 (six) hours as needed for wheezing or shortness of breath.  . ALPRAZolam (XANAX) 1 MG tablet TK 1 T PO TID PRN  . buPROPion (WELLBUTRIN SR) 100 MG 12 hr tablet Take 100 mg by mouth daily.  Marland Kitchen. buPROPion (WELLBUTRIN SR) 150 MG 12 hr tablet Take 150 mg by mouth 2 (two) times daily.  . cyclobenzaprine (FLEXERIL) 10 MG tablet TAKE 1 TABLET BY MOUTH THREE  TIMES A DAY AS NEEDED FOR MUSCLE SPASMS  . lisinopril (ZESTRIL) 40 MG tablet Take 1 tablet (40 mg total) by mouth daily.  . methylphenidate (RITALIN) 10 MG tablet Take 10 mg by mouth 3 (three) times daily.  . montelukast (SINGULAIR) 10 MG tablet Take 1 tablet (10 mg total) by mouth daily.  . potassium chloride (KLOR-CON M10) 10 MEQ tablet Take 1 tablet (10 mEq total) by mouth daily.  . promethazine (PHENERGAN) 25 MG tablet Take 1 tablet by mouth as needed for nausea  . zolmitriptan (ZOMIG) 5 MG nasal solution Place 1 spray into the nose as needed for migraine.  . [DISCONTINUED] atorvastatin (LIPITOR) 10 MG tablet Take 1 tablet (10 mg total) by mouth daily.  . [DISCONTINUED] zolmitriptan (ZOMIG) 5 MG tablet Take 1 tablet (5 mg total) by mouth as needed for migraine.  . Galcanezumab-gnlm (EMGALITY) 120 MG/ML SOSY Inject 120 mg into the skin every 30 (thirty) days. (Patient not taking: Reported on 11/18/2018)  . [DISCONTINUED] amphetamine-dextroamphetamine (ADDERALL) 30 MG tablet TK 1 T PO BID  . [DISCONTINUED] clarithromycin (BIAXIN XL) 500 MG 24 hr tablet Take 1 tablet (500 mg total) by mouth 2 (two) times daily. (Patient not taking: Reported on 11/18/2018)  . [DISCONTINUED] ibuprofen (ADVIL) 800 MG tablet 1 tablet (800 mg total) by Per NG tube route 3 (three) times daily as needed. (Patient not taking: Reported on 11/18/2018)  . [DISCONTINUED] mometasone (NASONEX) 50 MCG/ACT nasal spray Place 2 sprays into the nose daily. (Patient not taking: Reported on 11/18/2018)  No facility-administered encounter medications on file as of 11/18/2018.     Surgical History: History reviewed. No pertinent surgical history.  Medical History: Past Medical History:  Diagnosis Date  . Allergic rhinitis   . Asthma   . Hyperlipidemia   . Hypertension   . Migraines     Family History: Family History  Problem Relation Age of Onset  . Alcohol abuse Mother   . Asthma Mother   . Coronary artery disease Mother    . Hypertension Mother   . Migraines Mother   . Alcohol abuse Father   . Asthma Father   . Hypertension Father   . Diabetes Maternal Grandmother   . Diabetes Maternal Grandfather   . Diabetes Paternal Grandmother   . Diabetes Paternal Grandfather     Social History   Socioeconomic History  . Marital status: Married    Spouse name: Not on file  . Number of children: Not on file  . Years of education: Not on file  . Highest education level: Not on file  Occupational History  . Not on file  Social Needs  . Financial resource strain: Not on file  . Food insecurity    Worry: Not on file    Inability: Not on file  . Transportation needs    Medical: Not on file    Non-medical: Not on file  Tobacco Use  . Smoking status: Never Smoker  . Smokeless tobacco: Never Used  Substance and Sexual Activity  . Alcohol use: Never    Frequency: Never  . Drug use: Never  . Sexual activity: Not on file  Lifestyle  . Physical activity    Days per week: Not on file    Minutes per session: Not on file  . Stress: Not on file  Relationships  . Social Musicianconnections    Talks on phone: Not on file    Gets together: Not on file    Attends religious service: Not on file    Active member of club or organization: Not on file    Attends meetings of clubs or organizations: Not on file    Relationship status: Not on file  . Intimate partner violence    Fear of current or ex partner: Not on file    Emotionally abused: Not on file    Physically abused: Not on file    Forced sexual activity: Not on file  Other Topics Concern  . Not on file  Social History Narrative  . Not on file      Review of Systems  Constitutional: Negative for activity change, chills, fatigue and unexpected weight change.  HENT: Negative for congestion, postnasal drip, rhinorrhea, sneezing and sore throat.   Respiratory: Positive for shortness of breath and wheezing. Negative for cough and chest tightness.         Intermittent and worse with exercise and with hot and humid weather .  Cardiovascular: Negative for chest pain and palpitations.  Gastrointestinal: Negative for abdominal pain, constipation, diarrhea, nausea and vomiting.  Endocrine: Negative for cold intolerance, heat intolerance, polydipsia and polyuria.  Musculoskeletal: Positive for neck pain. Negative for arthralgias, back pain and joint swelling.  Skin: Negative for rash.  Allergic/Immunologic: Positive for environmental allergies.  Neurological: Positive for headaches. Negative for tremors and numbness.  Hematological: Negative for adenopathy. Does not bruise/bleed easily.  Psychiatric/Behavioral: Positive for dysphoric mood. Negative for behavioral problems (Depression), sleep disturbance and suicidal ideas. The patient is nervous/anxious.        Patient  routinely ses psychiatry for anxiety and focus issues.     Vital Signs: Pulse 95   SpO2 97%    Observation/Objective:   The patient is alert and oriented. She is pleasant and answers all questions appropriately. Breathing is non-labored. She is in no acute distress at this time.    Assessment/Plan: 1. Migraine without status migrainosus, not intractable, unspecified migraine type Continues to do well on Emgality for migraine prevention. Continue monthly. Use zomig as needed and as prescribed. May use with oral tablet or nasal spray.   2. Essential hypertension Generally stable. Continue bp medication as prescribed   3. Mild intermittent asthma without complication Use inhalers as needed and as prescribed.   4. Seasonal allergic rhinitis due to pollen Continue all allergy medication as prescribed .  General Counseling: Kathrene Alu understanding of the findings of today's phone visit and agrees with plan of treatment. I have discussed any further diagnostic evaluation that may be needed or ordered today. We also reviewed her medications today. she has been encouraged to  call the office with any questions or concerns that should arise related to todays visit.   Hypertension Counseling:   The following hypertensive lifestyle modification were recommended and discussed:  1. Limiting alcohol intake to less than 1 oz/day of ethanol:(24 oz of beer or 8 oz of wine or 2 oz of 100-proof whiskey). 2. Take baby ASA 81 mg daily. 3. Importance of regular aerobic exercise and losing weight. 4. Reduce dietary saturated fat and cholesterol intake for overall cardiovascular health. 5. Maintaining adequate dietary potassium, calcium, and magnesium intake. 6. Regular monitoring of the blood pressure. 7. Reduce sodium intake to less than 100 mmol/day (less than 2.3 gm of sodium or less than 6 gm of sodium choride)   This patient was seen by [place] with Dr Lavera Guise as a part of collaborative care agreement   Time spent: 75 Minutes    Dr Lavera Guise Internal medicine

## 2018-11-23 ENCOUNTER — Other Ambulatory Visit: Payer: Self-pay

## 2018-11-23 DIAGNOSIS — G43909 Migraine, unspecified, not intractable, without status migrainosus: Secondary | ICD-10-CM

## 2018-11-23 DIAGNOSIS — E782 Mixed hyperlipidemia: Secondary | ICD-10-CM

## 2018-11-23 MED ORDER — ATORVASTATIN CALCIUM 10 MG PO TABS: 10.0000 mg | ORAL_TABLET | Freq: Every day | ORAL | 5 refills | Status: DC

## 2018-11-23 MED ORDER — ZOLMITRIPTAN 5 MG PO TABS: 5.0000 mg | ORAL_TABLET | ORAL | 5 refills | Status: DC | PRN

## 2018-12-03 ENCOUNTER — Telehealth: Payer: Self-pay

## 2018-12-03 NOTE — Telephone Encounter (Signed)
Error

## 2018-12-13 ENCOUNTER — Telehealth: Payer: Self-pay

## 2018-12-13 NOTE — Telephone Encounter (Signed)
Left message regarding forms and paperwork she is requesting, will need to talk with patient. Beth

## 2018-12-28 ENCOUNTER — Other Ambulatory Visit: Payer: Self-pay | Admitting: Nurse Practitioner

## 2018-12-28 DIAGNOSIS — J301 Allergic rhinitis due to pollen: Secondary | ICD-10-CM

## 2018-12-28 MED ORDER — MONTELUKAST SODIUM 10 MG PO TABS: 10.0000 mg | ORAL_TABLET | Freq: Every day | ORAL | 5 refills | Status: DC

## 2019-01-26 ENCOUNTER — Encounter: Payer: Self-pay | Admitting: Adult Health

## 2019-01-26 ENCOUNTER — Ambulatory Visit (INDEPENDENT_AMBULATORY_CARE_PROVIDER_SITE_OTHER): Payer: BC Managed Care – PPO | Admitting: Nurse Practitioner

## 2019-01-26 ENCOUNTER — Other Ambulatory Visit: Payer: Self-pay

## 2019-01-26 VITALS — HR 66

## 2019-01-26 DIAGNOSIS — J4520 Mild intermittent asthma, uncomplicated: Secondary | ICD-10-CM | POA: Diagnosis not present

## 2019-01-26 DIAGNOSIS — J0101 Acute recurrent maxillary sinusitis: Secondary | ICD-10-CM

## 2019-01-26 DIAGNOSIS — G43909 Migraine, unspecified, not intractable, without status migrainosus: Secondary | ICD-10-CM

## 2019-01-26 MED ORDER — CLARITHROMYCIN ER 500 MG PO TB24: 1000.0000 mg | ORAL_TABLET | Freq: Every day | ORAL | 0 refills | Status: DC

## 2019-01-26 NOTE — Progress Notes (Signed)
[facility] [address] [place], [place] [address]  Internal MEDICINE  Telephone Visit  Patient Name: Linda Rocha Linda Rocha  [id number]  [id number]  Date of Service: 01/26/2019  I connected with the patient at 11:26am by webcam and verified the patients identity using two identifiers.   I discussed the limitations, risks, security and privacy concerns of performing an evaluation and management service by webcam and the availability of in person appointments. I also discussed with the patient that there may be a patient responsible charge related to the service.  The patient expressed understanding and agrees to proceed.    Chief Complaint  Patient presents with  . Telephone Assessment    poss sinus infection  . Telephone Screen  . Eye Pain    pain behind right eye, using migraine med  . Ear Pain    hurt on left side     The patient has been contacted via webcam for follow up visit due to concerns for spread of novel coronavirus. The patient is having headache behind the eyes with teeth hurting and nasal congestion. She denies fever and temperature is checked every day. She denies sore throat, nausea, or vomiting. Generally Biazin XR 500mg  BID works well for her. We have tried IR Biazin and this cuses her to have significant nausea with vomiting.       Current Medication: Outpatient Encounter Medications as of 01/26/2019  Medication Sig  . albuterol (PROVENTIL HFA;VENTOLIN HFA) 108 (90 Base) MCG/ACT inhaler Inhale 2 puffs into the lungs every 6 (six) hours as needed for wheezing or shortness of breath.  . ALPRAZolam (XANAX) 1 MG tablet TK 1 T PO TID PRN  . atorvastatin (LIPITOR) 10 MG tablet Take 1 tablet (10 mg total) by mouth daily.  Marland Kitchen. buPROPion (WELLBUTRIN SR) 100 MG 12 hr tablet Take 100 mg by mouth daily.  Marland Kitchen. buPROPion (WELLBUTRIN SR) 150 MG 12 hr tablet Take 150 mg by mouth 2 (two) times daily.  . cyclobenzaprine (FLEXERIL) 10 MG tablet TAKE 1 TABLET BY MOUTH THREE  TIMES A DAY AS NEEDED FOR MUSCLE SPASMS  . Galcanezumab-gnlm (EMGALITY) 120 MG/ML SOSY Inject 120 mg into the skin every 30 (thirty) days.  Marland Kitchen. lisinopril (ZESTRIL) 40 MG tablet Take 1 tablet (40 mg total) by mouth daily.  . methylphenidate (RITALIN) 10 MG tablet Take 10 mg by mouth 3 (three) times daily.  . montelukast (SINGULAIR) 10 MG tablet Take 1 tablet (10 mg total) by mouth daily.  . potassium chloride (KLOR-CON M10) 10 MEQ tablet Take 1 tablet (10 mEq total) by mouth daily.  . promethazine (PHENERGAN) 25 MG tablet Take 1 tablet by mouth as needed for nausea  . zolmitriptan (ZOMIG) 5 MG nasal solution Place 1 spray into the nose as needed for migraine.  . zolmitriptan (ZOMIG) 5 MG tablet Take 1 tablet (5 mg total) by mouth as needed for migraine.  . clarithromycin (BIAXIN XL) 500 MG 24 hr tablet Take 2 tablets (1,000 mg total) by mouth daily.   No facility-administered encounter medications on file as of 01/26/2019.     Surgical History: History reviewed. No pertinent surgical history.  Medical History: Past Medical History:  Diagnosis Date  . Allergic rhinitis   . Asthma   . Hyperlipidemia   . Hypertension   . Migraines     Family History: Family History  Problem Relation Age of Onset  . Alcohol abuse Mother   . Asthma Mother   . Coronary artery disease Mother   .  Hypertension Mother   . Migraines Mother   . Alcohol abuse Father   . Asthma Father   . Hypertension Father   . Diabetes Maternal Grandmother   . Diabetes Maternal Grandfather   . Diabetes Paternal Grandmother   . Diabetes Paternal Grandfather     Social History   Socioeconomic History  . Marital status: Married    Spouse name: Not on file  . Number of children: Not on file  . Years of education: Not on file  . Highest education level: Not on file  Occupational History  . Not on file  Social Needs  . Financial resource strain: Not on file  . Food insecurity    Worry: Not on file    Inability:  Not on file  . Transportation needs    Medical: Not on file    Non-medical: Not on file  Tobacco Use  . Smoking status: Never Smoker  . Smokeless tobacco: Never Used  Substance and Sexual Activity  . Alcohol use: Never    Frequency: Never  . Drug use: Never  . Sexual activity: Not on file  Lifestyle  . Physical activity    Days per week: Not on file    Minutes per session: Not on file  . Stress: Not on file  Relationships  . Social Musician on phone: Not on file    Gets together: Not on file    Attends religious service: Not on file    Active member of club or organization: Not on file    Attends meetings of clubs or organizations: Not on file    Relationship status: Not on file  . Intimate partner violence    Fear of current or ex partner: Not on file    Emotionally abused: Not on file    Physically abused: Not on file    Forced sexual activity: Not on file  Other Topics Concern  . Not on file  Social History Narrative  . Not on file      Review of Systems  Constitutional: Negative for chills, fatigue and fever.  HENT: Positive for congestion, ear pain, postnasal drip, rhinorrhea, sinus pressure and sinus pain. Negative for sore throat and voice change.   Respiratory: Negative for cough and wheezing.   Cardiovascular: Negative for chest pain and palpitations.  Gastrointestinal: Negative for nausea and vomiting.  Musculoskeletal: Negative for arthralgias and myalgias.  Allergic/Immunologic: Positive for environmental allergies.  Neurological: Positive for headaches.  Hematological: Negative for adenopathy.    Vital Signs: Pulse 66   SpO2 99%    Observation/Objective:   The patient is alert and oriented. She is pleasant and answers all questions appropriately. Breathing is non-labored. She is in no acute distress at this time.    Assessment/Plan: 1. Acute recurrent maxillary sinusitis biaxin XL 1000mg  daiy for 14 days. Rest and increase  fluids. Continue all allergy medications as indicated.  - clarithromycin (BIAXIN XL) 500 MG 24 hr tablet; Take 2 tablets (1,000 mg total) by mouth daily.  Dispense: 14 tablet; Refill: 0  2. Mild intermittent asthma without complication Inhalers should be used as needed and as prescribed .  3. Migraine without status migrainosus, not intractable, unspecified migraine type Use migraine meds as needed and as prescribed   General Counseling: Jeidi verbalizes understanding of the findings of today's phone visit and agrees with plan of treatment. I have discussed any further diagnostic evaluation that may be needed or ordered today. We also reviewed her  medications today. she has been encouraged to call the office with any questions or concerns that should arise related to todays visit.  Rest and increase fluids. Continue using OTC medication to control symptoms.   This patient was seen by [place] with Dr Lavera Guise as a part of collaborative care agreement  Meds ordered this encounter  Medications  . clarithromycin (BIAXIN XL) 500 MG 24 hr tablet    Sig: Take 2 tablets (1,000 mg total) by mouth daily.    Dispense:  14 tablet    Refill:  0    Immediate release Biaxin causes nausea and vomiting.    Order Specific Question:   Supervising Provider    Answer:   Lavera Guise [[id number]]    Time spent: 71 Minutes    Dr Lavera Guise Internal medicine

## 2019-02-03 ENCOUNTER — Other Ambulatory Visit: Payer: Self-pay | Admitting: Nurse Practitioner

## 2019-02-03 DIAGNOSIS — J0101 Acute recurrent maxillary sinusitis: Secondary | ICD-10-CM

## 2019-02-03 MED ORDER — CLARITHROMYCIN ER 500 MG PO TB24: 1000.0000 mg | ORAL_TABLET | Freq: Every day | ORAL | 0 refills | Status: DC

## 2019-02-07 ENCOUNTER — Other Ambulatory Visit: Payer: Self-pay

## 2019-02-07 DIAGNOSIS — G43909 Migraine, unspecified, not intractable, without status migrainosus: Secondary | ICD-10-CM

## 2019-02-07 DIAGNOSIS — R110 Nausea: Secondary | ICD-10-CM

## 2019-02-07 MED ORDER — PROMETHAZINE HCL 25 MG PO TABS: ORAL_TABLET | ORAL | 5 refills | Status: DC

## 2019-03-03 ENCOUNTER — Other Ambulatory Visit: Payer: Self-pay

## 2019-03-03 ENCOUNTER — Encounter: Payer: Self-pay | Admitting: Adult Health

## 2019-03-03 ENCOUNTER — Ambulatory Visit: Payer: BC Managed Care – PPO | Admitting: Adult Health

## 2019-03-03 DIAGNOSIS — G43909 Migraine, unspecified, not intractable, without status migrainosus: Secondary | ICD-10-CM

## 2019-03-03 DIAGNOSIS — J0101 Acute recurrent maxillary sinusitis: Secondary | ICD-10-CM | POA: Diagnosis not present

## 2019-03-03 MED ORDER — CLARITHROMYCIN ER 500 MG PO TB24: 1000.0000 mg | ORAL_TABLET | Freq: Every day | ORAL | 0 refills | Status: DC

## 2019-03-03 MED ORDER — ZOLMITRIPTAN 5 MG PO TABS: 5.0000 mg | ORAL_TABLET | ORAL | 5 refills | Status: DC | PRN

## 2019-03-03 NOTE — Progress Notes (Signed)
[facility] [place], [place] [address]  Internal MEDICINE  Telephone Visit  Patient Name: Linda Rocha  [id number]  [id number]  Date of Service: 03/03/2019  I connected with the patient at 1158 by telephone and verified the patients identity using two identifiers.   I discussed the limitations, risks, security and privacy concerns of performing an evaluation and management service by telephone and the availability of in person appointments. I also discussed with the patient that there may be a patient responsible charge related to the service.  The patient expressed understanding and agrees to proceed.    Chief Complaint  Patient presents with  . Telephone Assessment  . Telephone Screen  . Sinusitis    HPI  Pt seen via video.  She reports sinus congestion, posterior eye pressure, tooth pain.  She denies fever at this time.  She has a history of sinus infections.  She reports he has been using her migraine medication more frequently because the sinus pain is triggering her migraines.     Current Medication: Outpatient Encounter Medications as of 03/03/2019  Medication Sig  . albuterol (PROVENTIL HFA;VENTOLIN HFA) 108 (90 Base) MCG/ACT inhaler Inhale 2 puffs into the lungs every 6 (six) hours as needed for wheezing or shortness of breath.  . ALPRAZolam (XANAX) 1 MG tablet TK 1 T PO TID PRN  . atorvastatin (LIPITOR) 10 MG tablet Take 1 tablet (10 mg total) by mouth daily.  Marland Kitchen buPROPion (WELLBUTRIN SR) 100 MG 12 hr tablet Take 100 mg by mouth daily.  Marland Kitchen buPROPion (WELLBUTRIN SR) 150 MG 12 hr tablet Take 150 mg by mouth 2 (two) times daily.  . clarithromycin (BIAXIN XL) 500 MG 24 hr tablet Take 2 tablets (1,000 mg total) by mouth daily.  . cyclobenzaprine (FLEXERIL) 10 MG tablet TAKE 1 TABLET BY MOUTH THREE TIMES A DAY AS NEEDED FOR MUSCLE SPASMS  . Galcanezumab-gnlm (EMGALITY) 120 MG/ML SOSY Inject 120 mg into the skin every 30 (thirty) days.  Marland Kitchen lisinopril  (ZESTRIL) 40 MG tablet Take 1 tablet (40 mg total) by mouth daily.  . methylphenidate (RITALIN) 10 MG tablet Take 10 mg by mouth 3 (three) times daily.  . montelukast (SINGULAIR) 10 MG tablet Take 1 tablet (10 mg total) by mouth daily.  . potassium chloride (KLOR-CON M10) 10 MEQ tablet Take 1 tablet (10 mEq total) by mouth daily.  . promethazine (PHENERGAN) 25 MG tablet Take 1 tablet by mouth as needed for nausea  . zolmitriptan (ZOMIG) 5 MG nasal solution Place 1 spray into the nose as needed for migraine.  . zolmitriptan (ZOMIG) 5 MG tablet Take 1 tablet (5 mg total) by mouth as needed for migraine.  . [DISCONTINUED] clarithromycin (BIAXIN XL) 500 MG 24 hr tablet Take 2 tablets (1,000 mg total) by mouth daily.  . [DISCONTINUED] zolmitriptan (ZOMIG) 5 MG tablet Take 1 tablet (5 mg total) by mouth as needed for migraine.   No facility-administered encounter medications on file as of 03/03/2019.     Surgical History: History reviewed. No pertinent surgical history.  Medical History: Past Medical History:  Diagnosis Date  . Allergic rhinitis   . Asthma   . Hyperlipidemia   . Hypertension   . Migraines     Family History: Family History  Problem Relation Age of Onset  . Alcohol abuse Mother   . Asthma Mother   . Coronary artery disease Mother   . Hypertension Mother   . Migraines Mother   . Alcohol abuse  Father   . Asthma Father   . Hypertension Father   . Diabetes Maternal Grandmother   . Diabetes Maternal Grandfather   . Diabetes Paternal Grandmother   . Diabetes Paternal Grandfather     Social History   Socioeconomic History  . Marital status: Married    Spouse name: Not on file  . Number of children: Not on file  . Years of education: Not on file  . Highest education level: Not on file  Occupational History  . Not on file  Social Needs  . Financial resource strain: Not on file  . Food insecurity    Worry: Not on file    Inability: Not on file  .  Transportation needs    Medical: Not on file    Non-medical: Not on file  Tobacco Use  . Smoking status: Never Smoker  . Smokeless tobacco: Never Used  Substance and Sexual Activity  . Alcohol use: Never    Frequency: Never  . Drug use: Never  . Sexual activity: Not on file  Lifestyle  . Physical activity    Days per week: Not on file    Minutes per session: Not on file  . Stress: Not on file  Relationships  . Social Musician on phone: Not on file    Gets together: Not on file    Attends religious service: Not on file    Active member of club or organization: Not on file    Attends meetings of clubs or organizations: Not on file    Relationship status: Not on file  . Intimate partner violence    Fear of current or ex partner: Not on file    Emotionally abused: Not on file    Physically abused: Not on file    Forced sexual activity: Not on file  Other Topics Concern  . Not on file  Social History Narrative  . Not on file      Review of Systems  Constitutional: Negative for chills, fatigue and unexpected weight change.  HENT: Negative for congestion, rhinorrhea, sneezing and sore throat.   Eyes: Negative for photophobia, pain and redness.  Respiratory: Negative for cough, chest tightness and shortness of breath.   Cardiovascular: Negative for chest pain and palpitations.  Gastrointestinal: Negative for abdominal pain, constipation, diarrhea, nausea and vomiting.  Endocrine: Negative.   Genitourinary: Negative for dysuria and frequency.  Musculoskeletal: Negative for arthralgias, back pain, joint swelling and neck pain.  Skin: Negative for rash.  Allergic/Immunologic: Negative.   Neurological: Negative for tremors and numbness.  Hematological: Negative for adenopathy. Does not bruise/bleed easily.  Psychiatric/Behavioral: Negative for behavioral problems and sleep disturbance. The patient is not nervous/anxious.     Vital Signs: There were no vitals  taken for this visit.   Observation/Objective: Well appearing, NAD noted.    Assessment/Plan: 1. Acute recurrent maxillary sinusitis Advised patient to take entire course of antibiotics as prescribed with food. Pt should return to clinic in 7-10 days if symptoms fail to improve or new symptoms develop.  - clarithromycin (BIAXIN XL) 500 MG 24 hr tablet; Take 2 tablets (1,000 mg total) by mouth daily.  Dispense: 14 tablet; Refill: 0  2. Migraine without status migrainosus, not intractable, unspecified migraine type Refilled Zomig for patient at this time.  - zolmitriptan (ZOMIG) 5 MG tablet; Take 1 tablet (5 mg total) by mouth as needed for migraine.  Dispense: 16 tablet; Refill: 5  General Counseling: Swati verbalizes understanding of the  findings of today's phone visit and agrees with plan of treatment. I have discussed any further diagnostic evaluation that may be needed or ordered today. We also reviewed her medications today. she has been encouraged to call the office with any questions or concerns that should arise related to todays visit.    No orders of the defined types were placed in this encounter.   Meds ordered this encounter  Medications  . zolmitriptan (ZOMIG) 5 MG tablet    Sig: Take 1 tablet (5 mg total) by mouth as needed for migraine.    Dispense:  16 tablet    Refill:  5    Patient has history of frequenct and persistent migraine headaches  . clarithromycin (BIAXIN XL) 500 MG 24 hr tablet    Sig: Take 2 tablets (1,000 mg total) by mouth daily.    Dispense:  14 tablet    Refill:  0    Immediate release Biaxin causes nausea and vomiting.    Time spent: 15 Minutes    Blima LedgerAdam Novie Maggio AGNP-C Internal medicine

## 2019-03-11 ENCOUNTER — Other Ambulatory Visit: Payer: Self-pay | Admitting: Adult Health

## 2019-03-11 DIAGNOSIS — J0101 Acute recurrent maxillary sinusitis: Secondary | ICD-10-CM

## 2019-04-11 ENCOUNTER — Other Ambulatory Visit: Payer: Self-pay

## 2019-04-11 DIAGNOSIS — I10 Essential (primary) hypertension: Secondary | ICD-10-CM

## 2019-04-11 MED ORDER — LISINOPRIL 40 MG PO TABS: 40.0000 mg | ORAL_TABLET | Freq: Every day | ORAL | 0 refills | Status: DC

## 2019-04-11 MED ORDER — IBUPROFEN 800 MG PO TABS: 800.0000 mg | ORAL_TABLET | Freq: Three times a day (TID) | ORAL | 1 refills | Status: DC | PRN

## 2019-04-28 ENCOUNTER — Other Ambulatory Visit: Payer: Self-pay

## 2019-04-28 DIAGNOSIS — G43909 Migraine, unspecified, not intractable, without status migrainosus: Secondary | ICD-10-CM

## 2019-04-28 MED ORDER — ZOMIG 5 MG NA SOLN: 1.0000 | NASAL | 3 refills | Status: DC | PRN

## 2019-05-09 ENCOUNTER — Telehealth: Payer: Self-pay

## 2019-05-09 ENCOUNTER — Other Ambulatory Visit: Payer: Self-pay

## 2019-05-09 ENCOUNTER — Encounter: Payer: Self-pay | Admitting: Nurse Practitioner

## 2019-05-09 ENCOUNTER — Ambulatory Visit (INDEPENDENT_AMBULATORY_CARE_PROVIDER_SITE_OTHER): Payer: BC Managed Care – PPO | Admitting: Nurse Practitioner

## 2019-05-09 VITALS — Temp 98.7°F | Ht 61.5 in | Wt 160.0 lb

## 2019-05-09 DIAGNOSIS — I10 Essential (primary) hypertension: Secondary | ICD-10-CM

## 2019-05-09 DIAGNOSIS — G43909 Migraine, unspecified, not intractable, without status migrainosus: Secondary | ICD-10-CM | POA: Diagnosis not present

## 2019-05-09 DIAGNOSIS — J4520 Mild intermittent asthma, uncomplicated: Secondary | ICD-10-CM

## 2019-05-09 DIAGNOSIS — J0101 Acute recurrent maxillary sinusitis: Secondary | ICD-10-CM

## 2019-05-09 DIAGNOSIS — J301 Allergic rhinitis due to pollen: Secondary | ICD-10-CM

## 2019-05-09 MED ORDER — CLARITHROMYCIN ER 500 MG PO TB24: ORAL_TABLET | ORAL | 1 refills | Status: DC

## 2019-05-09 MED ORDER — ZOLMITRIPTAN 5 MG PO TABS: 5.0000 mg | ORAL_TABLET | ORAL | 5 refills | Status: DC | PRN

## 2019-05-09 NOTE — Telephone Encounter (Signed)
PRIOR AUTHORIZATION FOR ZOLMTRIPTAN HAS BEEN APPROVED FROM 05/09/2019-05/08/2022.TAT

## 2019-05-09 NOTE — Progress Notes (Cosign Needed)
[facility] [address] [place], [place] [address]  Internal MEDICINE  Telephone Visit  Patient Name: Linda Rocha  [id number]  [id number]  Date of Service: 05/09/2019  I connected with the patient at 9:26am by webcam and verified the patients identity using two identifiers.   I discussed the limitations, risks, security and privacy concerns of performing an evaluation and management service by webcam and the availability of in person appointments. I also discussed with the patient that there may be a patient responsible charge related to the service.  The patient expressed understanding and agrees to proceed.    Chief Complaint  Patient presents with  . Telephone Assessment  . Telephone Screen  . Sinusitis    PRESSURE BEHIND EYES, HEADACHE,TEETH HURT; TRAVELED AND DID NOT TAKE SINGULAIR     The patient has been contacted via webcam for follow up visit due to concerns for spread of novel coronavirus. The patient presents for acute visit. Today, she is complaining of sinus infection. She states that her teeth hurt and she has nasal congestion. She has a great deal of right frontal and maxillary sinus pressure and pain. She has been taking migraine medication to help sinus headaches and as a result, she is out of this medication. She denies fever, chills, or body aches. Her oxygen saturations stay above 95%.       Current Medication: Outpatient Encounter Medications as of 05/09/2019  Medication Sig  . albuterol (PROVENTIL HFA;VENTOLIN HFA) 108 (90 Base) MCG/ACT inhaler Inhale 2 puffs into the lungs every 6 (six) hours as needed for wheezing or shortness of breath.  . ALPRAZolam (XANAX) 1 MG tablet TK 1 T PO TID PRN  . atorvastatin (LIPITOR) 10 MG tablet Take 1 tablet (10 mg total) by mouth daily.  Marland Kitchen buPROPion (WELLBUTRIN SR) 100 MG 12 hr tablet Take 100 mg by mouth daily.  Marland Kitchen buPROPion (WELLBUTRIN SR) 150 MG 12 hr tablet Take 150 mg by mouth 2 (two) times daily.  .  clarithromycin (BIAXIN XL) 500 MG 24 hr tablet Take 1 tablet po BID for 14 days  . cyclobenzaprine (FLEXERIL) 10 MG tablet TAKE 1 TABLET BY MOUTH THREE TIMES A DAY AS NEEDED FOR MUSCLE SPASMS  . Galcanezumab-gnlm (EMGALITY) 120 MG/ML SOSY Inject 120 mg into the skin every 30 (thirty) days.  Marland Kitchen ibuprofen (ADVIL) 800 MG tablet Take 1 tablet (800 mg total) by mouth 3 (three) times daily as needed.  Marland Kitchen lisinopril (ZESTRIL) 40 MG tablet Take 1 tablet (40 mg total) by mouth daily.  . methylphenidate (RITALIN) 10 MG tablet Take 10 mg by mouth 3 (three) times daily.  . montelukast (SINGULAIR) 10 MG tablet Take 1 tablet (10 mg total) by mouth daily.  . potassium chloride (KLOR-CON M10) 10 MEQ tablet Take 1 tablet (10 mEq total) by mouth daily.  . promethazine (PHENERGAN) 25 MG tablet Take 1 tablet by mouth as needed for nausea  . zolmitriptan (ZOMIG) 5 MG nasal solution Place 1 spray into the nose as needed for migraine.  . zolmitriptan (ZOMIG) 5 MG tablet Take 1 tablet (5 mg total) by mouth as needed for migraine.  . [DISCONTINUED] clarithromycin (BIAXIN XL) 500 MG 24 hr tablet TAKE TWO TABLETS BY MOUTH EVERY DAY  . [DISCONTINUED] zolmitriptan (ZOMIG) 5 MG tablet Take 1 tablet (5 mg total) by mouth as needed for migraine.   No facility-administered encounter medications on file as of 05/09/2019.    Surgical History: History reviewed. No pertinent surgical history.  Medical  History: Past Medical History:  Diagnosis Date  . Allergic rhinitis   . Asthma   . Hyperlipidemia   . Hypertension   . Migraines     Family History: Family History  Problem Relation Age of Onset  . Alcohol abuse Mother   . Asthma Mother   . Coronary artery disease Mother   . Hypertension Mother   . Migraines Mother   . Alcohol abuse Father   . Asthma Father   . Hypertension Father   . Diabetes Maternal Grandmother   . Diabetes Maternal Grandfather   . Diabetes Paternal Grandmother   . Diabetes Paternal Grandfather      Social History   Socioeconomic History  . Marital status: Married    Spouse name: Not on file  . Number of children: Not on file  . Years of education: Not on file  . Highest education level: Not on file  Occupational History  . Not on file  Tobacco Use  . Smoking status: Never Smoker  . Smokeless tobacco: Never Used  Substance and Sexual Activity  . Alcohol use: Never  . Drug use: Never  . Sexual activity: Not on file  Other Topics Concern  . Not on file  Social History Narrative  . Not on file   Social Determinants of Health   Financial Resource Strain:   . Difficulty of Paying Living Expenses: Not on file  Food Insecurity:   . Worried About Charity fundraiser in the Last Year: Not on file  . Ran Out of Food in the Last Year: Not on file  Transportation Needs:   . Lack of Transportation (Medical): Not on file  . Lack of Transportation (Non-Medical): Not on file  Physical Activity:   . Days of Exercise per Week: Not on file  . Minutes of Exercise per Session: Not on file  Stress:   . Feeling of Stress : Not on file  Social Connections:   . Frequency of Communication with Friends and Family: Not on file  . Frequency of Social Gatherings with Friends and Family: Not on file  . Attends Religious Services: Not on file  . Active Member of Clubs or Organizations: Not on file  . Attends Archivist Meetings: Not on file  . Marital Status: Not on file  Intimate Partner Violence:   . Fear of Current or Ex-Partner: Not on file  . Emotionally Abused: Not on file  . Physically Abused: Not on file  . Sexually Abused: Not on file      Review of Systems  Constitutional: Negative for chills, fatigue and fever.  HENT: Positive for congestion, ear pain, postnasal drip, rhinorrhea, sinus pressure and sinus pain. Negative for sore throat and voice change.   Respiratory: Negative for cough and wheezing.   Cardiovascular: Negative for chest pain and palpitations.   Gastrointestinal: Negative for nausea and vomiting.  Musculoskeletal: Negative for arthralgias and myalgias.  Allergic/Immunologic: Positive for environmental allergies.  Neurological: Positive for headaches.  Hematological: Negative for adenopathy.    Today's Vitals   05/09/19 0836  Temp: 98.7 F (37.1 C)  SpO2: 98%  Weight: 160 lb (72.6 kg)  Height: 5' 1.5" (1.562 m)   Body mass index is 29.74 kg/m.  Observation/Objective:   The patient is alert and oriented. She is pleasant and answers all questions appropriately. Breathing is non-labored. She is in no acute distress at this time. The patient is nasally congested and sounds as though she does not feel well.  Assessment/Plan:  1. Acute recurrent maxillary sinusitis Start biaxin XL 500mg  bid for 14 days. Rest and increase fluids. May take OTC medications toimprove acute symptoms.  - clarithromycin (BIAXIN XL) 500 MG 24 hr tablet; Take 1 tablet po BID for 14 days  Dispense: 28 tablet; Refill: 1  2. Migraine without status migrainosus, not intractable, unspecified migraine type Take zomig as needed and as prescribe d - zolmitriptan (ZOMIG) 5 MG tablet; Take 1 tablet (5 mg total) by mouth as needed for migraine.  Dispense: 16 tablet; Refill: 5  3. Mild intermittent asthma without complication Continue inhalers as prescribed   4. Seasonal allergic rhinitis due to pollen Take oral medications and nasal spray as prescribed   5. Essential hypertension Stable. Continue bp medication as prescribed   General Counseling: understanding of the findings of today's phone visit and agrees with plan of treatment. I have discussed any further diagnostic evaluation that may be needed or ordered today. We also reviewed her medications today. she has been encouraged to call the office with any questions or concerns that should arise related to todays visit.  This patient was seen by Cecille Amsterdam FNP Collaboration with Dr  Vincent Gros as a part of collaborative care agreement  Meds ordered this encounter  Medications  . clarithromycin (BIAXIN XL) 500 MG 24 hr tablet    Sig: Take 1 tablet po BID for 14 days    Dispense:  28 tablet    Refill:  1    Order Specific Question:   Supervising Provider    Answer:   Lyndon Code [[id number]]  . zolmitriptan (ZOMIG) 5 MG tablet    Sig: Take 1 tablet (5 mg total) by mouth as needed for migraine.    Dispense:  16 tablet    Refill:  5    Patient has history of frequenct and persistent migraine headaches    Order Specific Question:   Supervising Provider    Answer:   Lyndon Code Lyndon Code    Time spent: 35 Minutes    Dr 26 Internal medicine

## 2019-05-12 ENCOUNTER — Other Ambulatory Visit: Payer: Self-pay

## 2019-05-12 DIAGNOSIS — I10 Essential (primary) hypertension: Secondary | ICD-10-CM

## 2019-05-12 MED ORDER — IBUPROFEN 800 MG PO TABS: 800.0000 mg | ORAL_TABLET | Freq: Three times a day (TID) | ORAL | 1 refills | Status: DC | PRN

## 2019-05-12 MED ORDER — LISINOPRIL 40 MG PO TABS: 40.0000 mg | ORAL_TABLET | Freq: Every day | ORAL | 0 refills | Status: DC

## 2019-05-19 ENCOUNTER — Ambulatory Visit: Payer: BC Managed Care – PPO | Admitting: Nurse Practitioner

## 2019-05-19 VITALS — Ht 61.5 in | Wt 160.0 lb

## 2019-05-19 DIAGNOSIS — I10 Essential (primary) hypertension: Secondary | ICD-10-CM | POA: Diagnosis not present

## 2019-05-19 DIAGNOSIS — J301 Allergic rhinitis due to pollen: Secondary | ICD-10-CM

## 2019-05-19 DIAGNOSIS — G43909 Migraine, unspecified, not intractable, without status migrainosus: Secondary | ICD-10-CM

## 2019-05-19 DIAGNOSIS — J4520 Mild intermittent asthma, uncomplicated: Secondary | ICD-10-CM

## 2019-05-19 DIAGNOSIS — M791 Myalgia, unspecified site: Secondary | ICD-10-CM

## 2019-05-19 MED ORDER — CYCLOBENZAPRINE HCL 10 MG PO TABS: ORAL_TABLET | ORAL | 1 refills | Status: DC

## 2019-05-19 NOTE — Progress Notes (Signed)
[facility] [place], [place] [address]  Internal MEDICINE  Telephone Visit  Patient Name: Linda Rocha  [id number]  [id number]  Date of Service: 05/21/2019  I connected with the patient at 4:55pm by webcam and verified the patients identity using two identifiers.   I discussed the limitations, risks, security and privacy concerns of performing an evaluation and management service by webcam and the availability of in person appointments. I also discussed with the patient that there may be a patient responsible charge related to the service.  The patient expressed understanding and agrees to proceed.    Chief Complaint  Patient presents with  . Telephone Assessment  . Telephone Screen  . Hypertension  . Hyperlipidemia    The patient has been contacted via webcam for follow up visit due to concerns for spread of novel coronavirus. The patient presents for routine follow up. She was recently treated for sinusitis. She states that she is feeling better. She states that she has been fatigued. She is due to have routine, fasting labs obtained. Her blood pressure is well conrolled and her migraines are managed well.       Current Medication: Outpatient Encounter Medications as of 05/19/2019  Medication Sig  . albuterol (PROVENTIL HFA;VENTOLIN HFA) 108 (90 Base) MCG/ACT inhaler Inhale 2 puffs into the lungs every 6 (six) hours as needed for wheezing or shortness of breath.  . ALPRAZolam (XANAX) 1 MG tablet TK 1 T PO TID PRN  . atorvastatin (LIPITOR) 10 MG tablet Take 1 tablet (10 mg total) by mouth daily.  Marland Kitchen buPROPion (WELLBUTRIN SR) 100 MG 12 hr tablet Take 100 mg by mouth daily.  Marland Kitchen buPROPion (WELLBUTRIN SR) 150 MG 12 hr tablet Take 150 mg by mouth 2 (two) times daily.  . clarithromycin (BIAXIN XL) 500 MG 24 hr tablet Take 1 tablet po BID for 14 days  . cyclobenzaprine (FLEXERIL) 10 MG tablet TAKE 1 TABLET BY MOUTH THREE TIMES A DAY AS NEEDED FOR MUSCLE SPASMS   . Galcanezumab-gnlm (EMGALITY) 120 MG/ML SOSY Inject 120 mg into the skin every 30 (thirty) days.  Marland Kitchen ibuprofen (ADVIL) 800 MG tablet Take 1 tablet (800 mg total) by mouth 3 (three) times daily as needed.  Marland Kitchen lisinopril (ZESTRIL) 40 MG tablet Take 1 tablet (40 mg total) by mouth daily.  . methylphenidate (RITALIN) 10 MG tablet Take 10 mg by mouth 3 (three) times daily.  . montelukast (SINGULAIR) 10 MG tablet Take 1 tablet (10 mg total) by mouth daily.  . potassium chloride (KLOR-CON M10) 10 MEQ tablet Take 1 tablet (10 mEq total) by mouth daily.  . promethazine (PHENERGAN) 25 MG tablet Take 1 tablet by mouth as needed for nausea  . zolmitriptan (ZOMIG) 5 MG nasal solution Place 1 spray into the nose as needed for migraine.  . zolmitriptan (ZOMIG) 5 MG tablet Take 1 tablet (5 mg total) by mouth as needed for migraine.  . [DISCONTINUED] cyclobenzaprine (FLEXERIL) 10 MG tablet TAKE 1 TABLET BY MOUTH THREE TIMES A DAY AS NEEDED FOR MUSCLE SPASMS   No facility-administered encounter medications on file as of 05/19/2019.    Surgical History: No past surgical history on file.  Medical History: Past Medical History:  Diagnosis Date  . Allergic rhinitis   . Asthma   . Hyperlipidemia   . Hypertension   . Migraines     Family History: Family History  Problem Relation Age of Onset  . Alcohol abuse Mother   . Asthma Mother   .  Coronary artery disease Mother   . Hypertension Mother   . Migraines Mother   . Alcohol abuse Father   . Asthma Father   . Hypertension Father   . Diabetes Maternal Grandmother   . Diabetes Maternal Grandfather   . Diabetes Paternal Grandmother   . Diabetes Paternal Grandfather     Social History   Socioeconomic History  . Marital status: Married    Spouse name: Not on file  . Number of children: Not on file  . Years of education: Not on file  . Highest education level: Not on file  Occupational History  . Not on file  Tobacco Use  . Smoking status:  Never Smoker  . Smokeless tobacco: Never Used  Substance and Sexual Activity  . Alcohol use: Never  . Drug use: Never  . Sexual activity: Not on file  Other Topics Concern  . Not on file  Social History Narrative  . Not on file   Social Determinants of Health   Financial Resource Strain:   . Difficulty of Paying Living Expenses: Not on file  Food Insecurity:   . Worried About Programme researcher, broadcasting/film/video in the Last Year: Not on file  . Ran Out of Food in the Last Year: Not on file  Transportation Needs:   . Lack of Transportation (Medical): Not on file  . Lack of Transportation (Non-Medical): Not on file  Physical Activity:   . Days of Exercise per Week: Not on file  . Minutes of Exercise per Session: Not on file  Stress:   . Feeling of Stress : Not on file  Social Connections:   . Frequency of Communication with Friends and Family: Not on file  . Frequency of Social Gatherings with Friends and Family: Not on file  . Attends Religious Services: Not on file  . Active Member of Clubs or Organizations: Not on file  . Attends Banker Meetings: Not on file  . Marital Status: Not on file  Intimate Partner Violence:   . Fear of Current or Ex-Partner: Not on file  . Emotionally Abused: Not on file  . Physically Abused: Not on file  . Sexually Abused: Not on file      Review of Systems  Constitutional: Positive for fatigue. Negative for chills and unexpected weight change.  HENT: Negative for congestion, rhinorrhea, sneezing and sore throat.   Respiratory: Negative for cough, chest tightness and shortness of breath.   Cardiovascular: Negative for chest pain and palpitations.  Gastrointestinal: Negative for abdominal pain, constipation, diarrhea, nausea and vomiting.  Endocrine: Negative for cold intolerance, heat intolerance, polydipsia and polyuria.  Musculoskeletal: Positive for back pain. Negative for arthralgias, joint swelling and neck pain.  Skin: Negative for  rash.  Allergic/Immunologic: Positive for environmental allergies.  Neurological: Positive for headaches. Negative for tremors and numbness.  Hematological: Negative for adenopathy. Does not bruise/bleed easily.  Psychiatric/Behavioral: Negative for behavioral problems and sleep disturbance. The patient is not nervous/anxious.     Today's Vitals   05/19/19 1543  Weight: 160 lb (72.6 kg)  Height: 5' 1.5" (1.562 m)   Body mass index is 29.74 kg/m.  Observation/Objective:   The patient is alert and oriented. She is pleasant and answers all questions appropriately. Breathing is non-labored. She is in no acute distress at this time.    Assessment/Plan: 1. Essential hypertension Stable. Continue bp medication as prescribed   2. Migraine without status migrainosus, not intractable, unspecified migraine type Flexeril has helped with migraines  and tension headaches in the past. Will renew prescription for flexeril 10mg  which may be taken up to three times daily if needed for neck pain and tension.  - cyclobenzaprine (FLEXERIL) 10 MG tablet; TAKE 1 TABLET BY MOUTH THREE TIMES A DAY AS NEEDED FOR MUSCLE SPASMS  Dispense: 90 tablet; Refill: 1  3. Mild intermittent asthma without complication Stable. Continue to use inhalers as prescribed   4. Seasonal allergic rhinitis due to pollen Use all allergy medication and nasal sprays as prescribed.   5. Myalgia May take cyclobenzaprine 10mg  up to three times daily as needed for muscle pain and tension.  - cyclobenzaprine (FLEXERIL) 10 MG tablet; TAKE 1 TABLET BY MOUTH THREE TIMES A DAY AS NEEDED FOR MUSCLE SPASMS  Dispense: 90 tablet; Refill: 1  General Counseling: Havannah verbalizes understanding of the findings of today's phone visit and agrees with plan of treatment. I have discussed any further diagnostic evaluation that may be needed or ordered today. We also reviewed her medications today. she has been encouraged to call the office with any  questions or concerns that should arise related to todays visit.   Hypertension Counseling:   The following hypertensive lifestyle modification were recommended and discussed:  1. Limiting alcohol intake to less than 1 oz/day of ethanol:(24 oz of beer or 8 oz of wine or 2 oz of 100-proof whiskey). 2. Take baby ASA 81 mg daily. 3. Importance of regular aerobic exercise and losing weight. 4. Reduce dietary saturated fat and cholesterol intake for overall cardiovascular health. 5. Maintaining adequate dietary potassium, calcium, and magnesium intake. 6. Regular monitoring of the blood pressure. 7. Reduce sodium intake to less than 100 mmol/day (less than 2.3 gm of sodium or less than 6 gm of sodium choride)   This patient was seen by FNP Collaboration with Dr as a part of collaborative care agreement  Meds ordered this encounter  Medications  . cyclobenzaprine (FLEXERIL) 10 MG tablet    Sig: TAKE 1 TABLET BY MOUTH THREE TIMES A DAY AS NEEDED FOR MUSCLE SPASMS    Dispense:  90 tablet    Refill:  1    Order Specific Question:   Supervising Provider    Answer:   Vincent Gros [[id number]]    Time spent: 25 Minutes    Dr Lyndon Code Internal medicine

## 2019-05-21 ENCOUNTER — Encounter: Payer: Self-pay | Admitting: Nurse Practitioner

## 2019-07-04 ENCOUNTER — Other Ambulatory Visit: Payer: Self-pay

## 2019-07-04 DIAGNOSIS — I10 Essential (primary) hypertension: Secondary | ICD-10-CM

## 2019-07-04 MED ORDER — LISINOPRIL 40 MG PO TABS: 40.0000 mg | ORAL_TABLET | Freq: Every day | ORAL | 0 refills | Status: DC

## 2019-07-12 ENCOUNTER — Other Ambulatory Visit: Payer: Self-pay

## 2019-07-12 DIAGNOSIS — J301 Allergic rhinitis due to pollen: Secondary | ICD-10-CM

## 2019-07-12 MED ORDER — MONTELUKAST SODIUM 10 MG PO TABS: 10.0000 mg | ORAL_TABLET | Freq: Every day | ORAL | 5 refills | Status: DC

## 2019-07-12 MED ORDER — IBUPROFEN 800 MG PO TABS: 800.0000 mg | ORAL_TABLET | Freq: Three times a day (TID) | ORAL | 1 refills | Status: DC | PRN

## 2019-07-14 ENCOUNTER — Encounter: Payer: Self-pay | Admitting: Adult Health

## 2019-07-14 ENCOUNTER — Ambulatory Visit: Payer: BC Managed Care – PPO | Admitting: Adult Health

## 2019-07-14 VITALS — Ht 61.5 in | Wt 160.0 lb

## 2019-07-14 DIAGNOSIS — G43909 Migraine, unspecified, not intractable, without status migrainosus: Secondary | ICD-10-CM | POA: Diagnosis not present

## 2019-07-14 DIAGNOSIS — J0110 Acute frontal sinusitis, unspecified: Secondary | ICD-10-CM

## 2019-07-14 DIAGNOSIS — I10 Essential (primary) hypertension: Secondary | ICD-10-CM

## 2019-07-14 DIAGNOSIS — J4520 Mild intermittent asthma, uncomplicated: Secondary | ICD-10-CM

## 2019-07-14 MED ORDER — CLARITHROMYCIN ER 500 MG PO TB24: 500.0000 mg | ORAL_TABLET | Freq: Two times a day (BID) | ORAL | 0 refills | Status: DC

## 2019-07-14 NOTE — Progress Notes (Signed)
[facility] [address] [place], [place] [address]  Internal MEDICINE  Telephone Visit  Patient Name: Linda Rocha  [id number]  [id number]  Date of Service: 07/14/2019  I connected with the patient at 156 by video and verified the patients identity using two identifiers.   I discussed the limitations, risks, security and privacy concerns of performing an evaluation and management service by telephone and the availability of in person appointments. I also discussed with the patient that there may be a patient responsible charge related to the service.  The patient expressed understanding and agrees to proceed.    Chief Complaint  Patient presents with  . Telephone Screen  . Telephone Assessment  . Sinusitis    going on for week    HPI  PT seen via video.  She reports sinus pressure, sinus pain, and ear pressure.  She denies any fever, or drainage.  She gets this from time to time.  She has not been around anyone with suspected covid.  She denies any other issues at this time.  She is taking her allergy medications as prescribed.    Current Medication: Outpatient Encounter Medications as of 07/14/2019  Medication Sig  . albuterol (PROVENTIL HFA;VENTOLIN HFA) 108 (90 Base) MCG/ACT inhaler Inhale 2 puffs into the lungs every 6 (six) hours as needed for wheezing or shortness of breath.  . ALPRAZolam (XANAX) 1 MG tablet TK 1 T PO TID PRN  . atorvastatin (LIPITOR) 10 MG tablet Take 1 tablet (10 mg total) by mouth daily.  Marland Kitchen buPROPion (WELLBUTRIN SR) 100 MG 12 hr tablet Take 100 mg by mouth daily.  Marland Kitchen buPROPion (WELLBUTRIN SR) 150 MG 12 hr tablet Take 150 mg by mouth 2 (two) times daily.  . clarithromycin (BIAXIN XL) 500 MG 24 hr tablet Take 1 tablet po BID for 14 days  . cyclobenzaprine (FLEXERIL) 10 MG tablet TAKE 1 TABLET BY MOUTH THREE TIMES A DAY AS NEEDED FOR MUSCLE SPASMS  . Galcanezumab-gnlm (EMGALITY) 120 MG/ML SOSY Inject 120 mg into the skin every 30 (thirty)  days.  Marland Kitchen ibuprofen (ADVIL) 800 MG tablet Take 1 tablet (800 mg total) by mouth 3 (three) times daily as needed.  Marland Kitchen lisinopril (ZESTRIL) 40 MG tablet Take 1 tablet (40 mg total) by mouth daily.  . methylphenidate (RITALIN) 10 MG tablet Take 10 mg by mouth 3 (three) times daily.  . montelukast (SINGULAIR) 10 MG tablet Take 1 tablet (10 mg total) by mouth daily.  . potassium chloride (KLOR-CON M10) 10 MEQ tablet Take 1 tablet (10 mEq total) by mouth daily.  . promethazine (PHENERGAN) 25 MG tablet Take 1 tablet by mouth as needed for nausea  . zolmitriptan (ZOMIG) 5 MG nasal solution Place 1 spray into the nose as needed for migraine.  . zolmitriptan (ZOMIG) 5 MG tablet Take 1 tablet (5 mg total) by mouth as needed for migraine.  . clarithromycin (BIAXIN XL) 500 MG 24 hr tablet Take 1 tablet (500 mg total) by mouth 2 (two) times daily.   No facility-administered encounter medications on file as of 07/14/2019.    Surgical History: History reviewed. No pertinent surgical history.  Medical History: Past Medical History:  Diagnosis Date  . Allergic rhinitis   . Asthma   . Hyperlipidemia   . Hypertension   . Migraines     Family History: Family History  Problem Relation Age of Onset  . Alcohol abuse Mother   . Asthma Mother   . Coronary artery disease Mother   .  Hypertension Mother   . Migraines Mother   . Alcohol abuse Father   . Asthma Father   . Hypertension Father   . Diabetes Maternal Grandmother   . Diabetes Maternal Grandfather   . Diabetes Paternal Grandmother   . Diabetes Paternal Grandfather     Social History   Socioeconomic History  . Marital status: Married    Spouse name: Not on file  . Number of children: Not on file  . Years of education: Not on file  . Highest education level: Not on file  Occupational History  . Not on file  Tobacco Use  . Smoking status: Never Smoker  . Smokeless tobacco: Never Used  Substance and Sexual Activity  . Alcohol use:  Never  . Drug use: Never  . Sexual activity: Not on file  Other Topics Concern  . Not on file  Social History Narrative  . Not on file   Social Determinants of Health   Financial Resource Strain:   . Difficulty of Paying Living Expenses:   Food Insecurity:   . Worried About Programme researcher, broadcasting/film/video in the Last Year:   . Barista in the Last Year:   Transportation Needs:   . Freight forwarder (Medical):   Marland Kitchen Lack of Transportation (Non-Medical):   Physical Activity:   . Days of Exercise per Week:   . Minutes of Exercise per Session:   Stress:   . Feeling of Stress :   Social Connections:   . Frequency of Communication with Friends and Family:   . Frequency of Social Gatherings with Friends and Family:   . Attends Religious Services:   . Active Member of Clubs or Organizations:   . Attends Banker Meetings:   Marland Kitchen Marital Status:   Intimate Partner Violence:   . Fear of Current or Ex-Partner:   . Emotionally Abused:   Marland Kitchen Physically Abused:   . Sexually Abused:       Review of Systems  Constitutional: Negative for chills, fatigue and unexpected weight change.  HENT: Positive for ear pain, sinus pressure and sinus pain. Negative for congestion, rhinorrhea, sneezing and sore throat.   Eyes: Negative for photophobia, pain and redness.  Respiratory: Negative for cough, chest tightness and shortness of breath.   Cardiovascular: Negative for chest pain and palpitations.  Gastrointestinal: Negative for abdominal pain, constipation, diarrhea, nausea and vomiting.  Endocrine: Negative.   Genitourinary: Negative for dysuria and frequency.  Musculoskeletal: Negative for arthralgias, back pain, joint swelling and neck pain.  Skin: Negative for rash.  Allergic/Immunologic: Negative.   Neurological: Negative for tremors and numbness.  Hematological: Negative for adenopathy. Does not bruise/bleed easily.  Psychiatric/Behavioral: Negative for behavioral problems and  sleep disturbance. The patient is not nervous/anxious.     Vital Signs: Ht 5' 1.5" (1.562 m)   Wt 160 lb (72.6 kg)   BMI 29.74 kg/m    Observation/Objective:  Well appearing, NAD noted.    Assessment/Plan: 1. Subacute frontal sinusitis Advised patient to take entire course of antibiotics as prescribed with food. Pt should return to clinic in 7-10 days if symptoms fail to improve or new symptoms develop.  - clarithromycin (BIAXIN XL) 500 MG 24 hr tablet; Take 1 tablet (500 mg total) by mouth 2 (two) times daily.  Dispense: 28 tablet; Refill: 0  2. Essential hypertension Controlled, continue to monitor.  3. Migraine without status migrainosus, not intractable, unspecified migraine type Continue present treatment.  4. Mild intermittent asthma without  complication Stable, continue to follow  General Counseling: Kathrene Alu understanding of the findings of today's phone visit and agrees with plan of treatment. I have discussed any further diagnostic evaluation that may be needed or ordered today. We also reviewed her medications today. she has been encouraged to call the office with any questions or concerns that should arise related to todays visit.    No orders of the defined types were placed in this encounter.   Meds ordered this encounter  Medications  . clarithromycin (BIAXIN XL) 500 MG 24 hr tablet    Sig: Take 1 tablet (500 mg total) by mouth 2 (two) times daily.    Dispense:  28 tablet    Refill:  0    Time spent: [place] AGNP-C Internal medicine

## 2019-07-15 ENCOUNTER — Other Ambulatory Visit: Payer: Self-pay

## 2019-07-15 DIAGNOSIS — J0110 Acute frontal sinusitis, unspecified: Secondary | ICD-10-CM

## 2019-07-15 MED ORDER — CLARITHROMYCIN ER 500 MG PO TB24: 500.0000 mg | ORAL_TABLET | Freq: Two times a day (BID) | ORAL | 0 refills | Status: DC

## 2019-07-15 NOTE — Telephone Encounter (Signed)
Pt called stating that her [facility] pharmacy cannot fill biaxin until 07/18/19 and pt states she cannot wait that long for the rx. [facility] for [facility].

## 2019-08-17 ENCOUNTER — Other Ambulatory Visit: Payer: Self-pay

## 2019-08-17 DIAGNOSIS — E782 Mixed hyperlipidemia: Secondary | ICD-10-CM

## 2019-08-17 MED ORDER — ATORVASTATIN CALCIUM 10 MG PO TABS: 10.0000 mg | ORAL_TABLET | Freq: Every day | ORAL | 5 refills | Status: DC

## 2019-09-13 ENCOUNTER — Other Ambulatory Visit: Payer: Self-pay

## 2019-09-13 DIAGNOSIS — G43909 Migraine, unspecified, not intractable, without status migrainosus: Secondary | ICD-10-CM

## 2019-09-13 DIAGNOSIS — R110 Nausea: Secondary | ICD-10-CM

## 2019-09-13 MED ORDER — PROMETHAZINE HCL 25 MG PO TABS: ORAL_TABLET | ORAL | 5 refills | Status: DC

## 2019-09-22 ENCOUNTER — Ambulatory Visit: Payer: BC Managed Care – PPO | Admitting: Nurse Practitioner

## 2019-09-22 ENCOUNTER — Telehealth: Payer: Self-pay

## 2019-09-22 NOTE — Telephone Encounter (Signed)
Lmom to confirm and screen for 09-22-19 ov. 

## 2019-09-23 ENCOUNTER — Telehealth: Payer: Self-pay

## 2019-09-23 NOTE — Telephone Encounter (Signed)
BILLED MISSED APPOINTMENT FEE 09/22/19

## 2019-10-13 ENCOUNTER — Encounter: Payer: Self-pay | Admitting: Nurse Practitioner

## 2019-10-13 ENCOUNTER — Other Ambulatory Visit: Payer: Self-pay

## 2019-10-13 ENCOUNTER — Ambulatory Visit (INDEPENDENT_AMBULATORY_CARE_PROVIDER_SITE_OTHER): Payer: BC Managed Care – PPO | Admitting: Nurse Practitioner

## 2019-10-13 VITALS — BP 128/86 | HR 96 | Temp 97.8°F | Resp 16 | Ht 61.5 in | Wt 166.8 lb

## 2019-10-13 DIAGNOSIS — I10 Essential (primary) hypertension: Secondary | ICD-10-CM

## 2019-10-13 DIAGNOSIS — R110 Nausea: Secondary | ICD-10-CM | POA: Diagnosis not present

## 2019-10-13 DIAGNOSIS — M791 Myalgia, unspecified site: Secondary | ICD-10-CM

## 2019-10-13 DIAGNOSIS — J4520 Mild intermittent asthma, uncomplicated: Secondary | ICD-10-CM

## 2019-10-13 DIAGNOSIS — G43909 Migraine, unspecified, not intractable, without status migrainosus: Secondary | ICD-10-CM | POA: Diagnosis not present

## 2019-10-13 MED ORDER — CYCLOBENZAPRINE HCL 10 MG PO TABS: ORAL_TABLET | ORAL | 1 refills | Status: DC

## 2019-10-13 MED ORDER — PROMETHAZINE HCL 25 MG PO TABS: ORAL_TABLET | ORAL | 5 refills | Status: DC

## 2019-10-13 MED ORDER — ZOLMITRIPTAN 5 MG PO TABS: 5.0000 mg | ORAL_TABLET | ORAL | 5 refills | Status: DC | PRN

## 2019-10-13 NOTE — Progress Notes (Signed)
[facility] [address] [place], [place] [address]  Internal MEDICINE  Office Visit Note  Patient Name: Linda Rocha  [id number]  [id number]  Date of Service: 10/23/2019  Chief Complaint  Patient presents with  . Follow-up  . Hyperlipidemia  . Hypertension    The patient is here for follow up visit.  -chronic migraines - stopped emgality for prevention. Was causing her negative side effects. Takes combination zomig, cyclobenzaprine, and promethazine as needed for acute migraines. Needs refills for these today.  -good blood pressure.  -continues to see psychiatry for medication to treat anxiety, depression, and ADD. -has done well with managing allergies and chronic sinusitis past few months.       Current Medication: Outpatient Encounter Medications as of 10/13/2019  Medication Sig Note  . albuterol (PROVENTIL HFA;VENTOLIN HFA) 108 (90 Base) MCG/ACT inhaler Inhale 2 puffs into the lungs every 6 (six) hours as needed for wheezing or shortness of breath.   . ALPRAZolam (XANAX) 1 MG tablet TK 1 T PO TID PRN   . atorvastatin (LIPITOR) 10 MG tablet Take 1 tablet (10 mg total) by mouth daily.   Marland Kitchen buPROPion (WELLBUTRIN SR) 100 MG 12 hr tablet Take 100 mg by mouth daily.   Marland Kitchen buPROPion (WELLBUTRIN SR) 150 MG 12 hr tablet Take 150 mg by mouth 2 (two) times daily.   . cyclobenzaprine (FLEXERIL) 10 MG tablet TAKE 1 TABLET BY MOUTH THREE TIMES A DAY AS NEEDED FOR MUSCLE SPASMS   . methylphenidate (RITALIN) 10 MG tablet Take 10 mg by mouth 3 (three) times daily.   . montelukast (SINGULAIR) 10 MG tablet Take 1 tablet (10 mg total) by mouth daily.   . potassium chloride (KLOR-CON M10) 10 MEQ tablet Take 1 tablet (10 mEq total) by mouth daily.   . promethazine (PHENERGAN) 25 MG tablet Take 1 tablet by mouth as needed for nausea   . zolmitriptan (ZOMIG) 5 MG tablet Take 1 tablet (5 mg total) by mouth as needed for migraine.   . [DISCONTINUED] clarithromycin (BIAXIN XL) 500 MG  24 hr tablet Take 1 tablet po BID for 14 days   . [DISCONTINUED] clarithromycin (BIAXIN XL) 500 MG 24 hr tablet Take 1 tablet (500 mg total) by mouth 2 (two) times daily.   . [DISCONTINUED] cyclobenzaprine (FLEXERIL) 10 MG tablet TAKE 1 TABLET BY MOUTH THREE TIMES A DAY AS NEEDED FOR MUSCLE SPASMS   . [DISCONTINUED] Galcanezumab-gnlm (EMGALITY) 120 MG/ML SOSY Inject 120 mg into the skin every 30 (thirty) days. 10/13/2019: patient stopped because she started gaining weight.   . [DISCONTINUED] ibuprofen (ADVIL) 800 MG tablet Take 1 tablet (800 mg total) by mouth 3 (three) times daily as needed.   . [DISCONTINUED] lisinopril (ZESTRIL) 40 MG tablet Take 1 tablet (40 mg total) by mouth daily.   . [DISCONTINUED] promethazine (PHENERGAN) 25 MG tablet Take 1 tablet by mouth as needed for nausea   . [DISCONTINUED] zolmitriptan (ZOMIG) 5 MG nasal solution Place 1 spray into the nose as needed for migraine.   . [DISCONTINUED] zolmitriptan (ZOMIG) 5 MG tablet Take 1 tablet (5 mg total) by mouth as needed for migraine.    No facility-administered encounter medications on file as of 10/13/2019.    Surgical History: History reviewed. No pertinent surgical history.  Medical History: Past Medical History:  Diagnosis Date  . Allergic rhinitis   . Asthma   . Hyperlipidemia   . Hypertension   . Migraines     Family History: Family History  Problem Relation Age of Onset  . Alcohol abuse Mother   . Asthma Mother   . Coronary artery disease Mother   . Hypertension Mother   . Migraines Mother   . Alcohol abuse Father   . Asthma Father   . Hypertension Father   . Diabetes Maternal Grandmother   . Diabetes Maternal Grandfather   . Diabetes Paternal Grandmother   . Diabetes Paternal Grandfather     Social History   Socioeconomic History  . Marital status: Married    Spouse name: Not on file  . Number of children: Not on file  . Years of education: Not on file  . Highest education level: Not on  file  Occupational History  . Not on file  Tobacco Use  . Smoking status: Never Smoker  . Smokeless tobacco: Never Used  Substance and Sexual Activity  . Alcohol use: Never  . Drug use: Never  . Sexual activity: Not on file  Other Topics Concern  . Not on file  Social History Narrative  . Not on file   Social Determinants of Health   Financial Resource Strain:   . Difficulty of Paying Living Expenses:   Food Insecurity:   . Worried About Charity fundraiser in the Last Year:   . Arboriculturist in the Last Year:   Transportation Needs:   . Film/video editor (Medical):   Marland Kitchen Lack of Transportation (Non-Medical):   Physical Activity:   . Days of Exercise per Week:   . Minutes of Exercise per Session:   Stress:   . Feeling of Stress :   Social Connections:   . Frequency of Communication with Friends and Family:   . Frequency of Social Gatherings with Friends and Family:   . Attends Religious Services:   . Active Member of Clubs or Organizations:   . Attends Archivist Meetings:   Marland Kitchen Marital Status:   Intimate Partner Violence:   . Fear of Current or Ex-Partner:   . Emotionally Abused:   Marland Kitchen Physically Abused:   . Sexually Abused:       Review of Systems  Constitutional: Positive for fatigue. Negative for chills and unexpected weight change.  HENT: Negative for congestion, rhinorrhea, sneezing and sore throat.   Respiratory: Negative for cough, chest tightness and shortness of breath.   Cardiovascular: Negative for chest pain and palpitations.  Gastrointestinal: Negative for abdominal pain, constipation, diarrhea, nausea and vomiting.  Endocrine: Negative for cold intolerance, heat intolerance, polydipsia and polyuria.  Musculoskeletal: Positive for back pain. Negative for arthralgias, joint swelling and neck pain.  Skin: Negative for rash.  Allergic/Immunologic: Positive for environmental allergies.  Neurological: Positive for headaches. Negative for  tremors and numbness.       Chronic migraine headaches. She has stopped taking injectable prevention medication for migraines as it was causing her to have negative side effects. She also didn't feel as though she were getting relief from this.   Hematological: Negative for adenopathy. Does not bruise/bleed easily.  Psychiatric/Behavioral: Negative for behavioral problems and sleep disturbance. The patient is not nervous/anxious.        The patient routinely sees psychiatry.     Today's Vitals   10/13/19 1403  BP: 128/86  Pulse: 96  Resp: 16  Temp: 97.8 F (36.6 C)  SpO2: 99%  Weight: 166 lb 12.8 oz (75.7 kg)  Height: 5' 1.5" (1.562 m)   Body mass index is 31.01 kg/m.  Physical Exam Vitals and  nursing note reviewed.  Constitutional:      General: She is not in acute distress.    Appearance: Normal appearance. She is well-developed. She is not diaphoretic.  HENT:     Head: Normocephalic and atraumatic.     Nose: Nose normal.     Mouth/Throat:     Pharynx: No oropharyngeal exudate.  Eyes:     Pupils: Pupils are equal, round, and reactive to light.  Neck:     Thyroid: No thyromegaly.     Vascular: No JVD.     Trachea: No tracheal deviation.  Cardiovascular:     Rate and Rhythm: Normal rate and regular rhythm.     Heart sounds: Normal heart sounds. No murmur heard.  No friction rub. No gallop.   Pulmonary:     Effort: Pulmonary effort is normal. No respiratory distress.     Breath sounds: Normal breath sounds. No wheezing or rales.  Chest:     Chest wall: No tenderness.  Abdominal:     Palpations: Abdomen is soft.  Musculoskeletal:        General: Normal range of motion.     Cervical back: Normal range of motion and neck supple.  Lymphadenopathy:     Cervical: No cervical adenopathy.  Skin:    General: Skin is warm and dry.  Neurological:     General: No focal deficit present.     Mental Status: She is alert and oriented to person, place, and time.     Cranial  Nerves: No cranial nerve deficit.  Psychiatric:        Mood and Affect: Mood normal.        Behavior: Behavior normal.        Thought Content: Thought content normal.        Judgment: Judgment normal.    Assessment/Plan: 1. Migraine without status migrainosus, not intractable, unspecified migraine type May continue to take zomig 5mg  as needed as as prescribed for acute migraines. Flexeril 10mg  may be taken to help with neck pain and tension when having migraine. May also use phenergan 25mg  as needed for nausea related to migraines. Refills sent to pharmacy for all.  - cyclobenzaprine (FLEXERIL) 10 MG tablet; TAKE 1 TABLET BY MOUTH THREE TIMES A DAY AS NEEDED FOR MUSCLE SPASMS  Dispense: 90 tablet; Refill: 1 - promethazine (PHENERGAN) 25 MG tablet; Take 1 tablet by mouth as needed for nausea  Dispense: 30 tablet; Refill: 5 - zolmitriptan (ZOMIG) 5 MG tablet; Take 1 tablet (5 mg total) by mouth as needed for migraine.  Dispense: 16 tablet; Refill: 5  2. Myalgia Take flexeril 10mg  as needed for neck pain and tension related to migraine headaches.  - cyclobenzaprine (FLEXERIL) 10 MG tablet; TAKE 1 TABLET BY MOUTH THREE TIMES A DAY AS NEEDED FOR MUSCLE SPASMS  Dispense: 90 tablet; Refill: 1  3. Nausea Take promethazine 25mg  as needed for nausea related to migraine headaches.  - promethazine (PHENERGAN) 25 MG tablet; Take 1 tablet by mouth as needed for nausea  Dispense: 30 tablet; Refill: 5  4. Essential hypertension Well managed. Continue bp medication as prescribed  5. Mild intermittent asthma without complication Well controlled. continue with inhalers and all respiratory medications as prescribed    General Counseling: understanding of the findings of todays visit and agrees with plan of treatment. I have discussed any further diagnostic evaluation that may be needed or ordered today. We also reviewed her medications today. she has been encouraged to call the  office with  any questions or concerns that should arise related to todays visit.  This patient was seen by Vincent Gros FNP Collaboration with Dr Lyndon Code as a part of collaborative care agreement  Meds ordered this encounter  Medications  . cyclobenzaprine (FLEXERIL) 10 MG tablet    Sig: TAKE 1 TABLET BY MOUTH THREE TIMES A DAY AS NEEDED FOR MUSCLE SPASMS    Dispense:  90 tablet    Refill:  1    Order Specific Question:   Supervising Provider    Answer:   Lyndon Code [[id number]]  . promethazine (PHENERGAN) 25 MG tablet    Sig: Take 1 tablet by mouth as needed for nausea    Dispense:  30 tablet    Refill:  5    Pt need appt for further refills    Order Specific Question:   Supervising Provider    Answer:   Lyndon Code [[id number]]  . zolmitriptan (ZOMIG) 5 MG tablet    Sig: Take 1 tablet (5 mg total) by mouth as needed for migraine.    Dispense:  16 tablet    Refill:  5    Patient has history of frequenct and persistent migraine headaches    Order Specific Question:   Supervising Provider    Answer:   Lyndon Code [[id number]]    Total time spent: 20 Minutes   Time spent includes review of chart, medications, test results, and follow up plan with the patient.      Dr Lyndon Code Internal medicine

## 2019-10-14 ENCOUNTER — Other Ambulatory Visit: Payer: Self-pay

## 2019-10-14 DIAGNOSIS — I10 Essential (primary) hypertension: Secondary | ICD-10-CM

## 2019-10-14 MED ORDER — LISINOPRIL 40 MG PO TABS: 40.0000 mg | ORAL_TABLET | Freq: Every day | ORAL | 1 refills | Status: DC

## 2019-10-14 MED ORDER — IBUPROFEN 800 MG PO TABS: 800.0000 mg | ORAL_TABLET | Freq: Three times a day (TID) | ORAL | 1 refills | Status: DC | PRN

## 2019-11-17 ENCOUNTER — Telehealth: Payer: Self-pay

## 2019-11-17 ENCOUNTER — Other Ambulatory Visit: Payer: Self-pay | Admitting: Nurse Practitioner

## 2019-11-17 DIAGNOSIS — J0100 Acute maxillary sinusitis, unspecified: Secondary | ICD-10-CM

## 2019-11-17 MED ORDER — CLARITHROMYCIN ER 500 MG PO TB24: 500.0000 mg | ORAL_TABLET | Freq: Two times a day (BID) | ORAL | 0 refills | Status: DC

## 2019-11-17 NOTE — Telephone Encounter (Signed)
Please let her know I sent in biaxin XL 500mg  twice daily for next 14 days [facility]. Thanks.

## 2019-11-25 ENCOUNTER — Other Ambulatory Visit: Payer: Self-pay

## 2019-11-25 MED ORDER — IBUPROFEN 800 MG PO TABS: 800.0000 mg | ORAL_TABLET | Freq: Three times a day (TID) | ORAL | 1 refills | Status: DC | PRN

## 2019-12-19 ENCOUNTER — Encounter: Payer: Self-pay | Admitting: Adult Health

## 2019-12-19 ENCOUNTER — Ambulatory Visit: Payer: BC Managed Care – PPO | Admitting: Adult Health

## 2019-12-19 VITALS — BP 135/84 | Resp 16 | Ht 61.25 in | Wt 150.0 lb

## 2019-12-19 DIAGNOSIS — I10 Essential (primary) hypertension: Secondary | ICD-10-CM

## 2019-12-19 DIAGNOSIS — J0100 Acute maxillary sinusitis, unspecified: Secondary | ICD-10-CM

## 2019-12-19 DIAGNOSIS — R6889 Other general symptoms and signs: Secondary | ICD-10-CM

## 2019-12-19 NOTE — Progress Notes (Signed)
[facility] [address] [place], [place] [address]  Internal MEDICINE  Telephone Visit  Patient Name: Linda Rocha  [id number]  [id number]  Date of Service: 12/19/2019  I connected with the patient at 1026 by telephone and verified the patients identity using two identifiers.   I discussed the limitations, risks, security and privacy concerns of performing an evaluation and management service by telephone and the availability of in person appointments. I also discussed with the patient that there may be a patient responsible charge related to the service.  The patient expressed understanding and agrees to proceed.    Chief Complaint  Patient presents with  . Telephone Assessment  . Telephone Screen  . heat intolerance    hard to regulate her body temperature since having covid-19  . Covid Exposure    tested negative on 12/06/19 but lost taste and smell and still it hasn't came back, quarentined 14 days returned 12/14/19  . Ear Fullness    in left ear feels she has fluid in ear  . Quality Metric Gaps    HIV screen, Tdap    HPI  Patient seen via telephone. She reports recently recovering from covid-19.  She has been batteling ongoing sinus and ear symptoms.  She has taken a z pak and biaxin but continues to have some intermittent symptoms.  She also reports that since having covid she is unable to be outside in the heat for any amount of time.  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 She feels short of breath, and weak when in the heath.      Current Medication: Outpatient Encounter Medications as of 12/19/2019  Medication Sig  . albuterol (PROVENTIL HFA;VENTOLIN HFA) 108 (90 Base) MCG/ACT inhaler Inhale 2 puffs into the lungs every 6 (six) hours as needed for wheezing or shortness of breath.  . ALPRAZolam (XANAX) 1 MG tablet TK 1 T PO TID PRN  . amphetamine-dextroamphetamine (ADDERALL) 15 MG  tablet Take 15 mg by mouth 3 (three) times daily. Dr Evelene Croon in [place]  . atorvastatin (LIPITOR) 10 MG tablet Take 1 tablet (10 mg total) by mouth daily.  Marland Kitchen buPROPion (WELLBUTRIN SR) 100 MG 12 hr tablet Take 100 mg by mouth daily.  Marland Kitchen buPROPion (WELLBUTRIN SR) 150 MG 12 hr tablet Take 150 mg by mouth 2 (two) times daily.  . cyclobenzaprine (FLEXERIL) 10 MG tablet TAKE 1 TABLET BY MOUTH THREE TIMES A DAY AS NEEDED FOR MUSCLE SPASMS  . ibuprofen (ADVIL) 800 MG tablet Take 1 tablet (800 mg total) by mouth 3 (three) times daily as needed.  Marland Kitchen lisinopril (ZESTRIL) 40 MG tablet Take 1 tablet (40 mg total) by mouth daily.  . montelukast (SINGULAIR) 10 MG tablet Take 1 tablet (10 mg total) by mouth daily.  . promethazine (PHENERGAN) 25 MG tablet Take 1 tablet by mouth as needed for nausea  . zolmitriptan (ZOMIG) 5 MG tablet Take 1 tablet (5 mg total) by mouth as needed for migraine.  . [DISCONTINUED] clarithromycin (BIAXIN XL) 500 MG 24 hr tablet Take 1 tablet (500 mg total) by mouth 2 (two) times daily. (Patient not taking: Reported on 12/19/2019)  . [DISCONTINUED] methylphenidate (RITALIN) 10 MG tablet Take 10 mg by mouth 3 (three) times daily. (Patient not taking: Reported on 12/19/2019)  . [DISCONTINUED] potassium chloride (KLOR-CON M10) 10 MEQ tablet Take 1 tablet (10 mEq total) by mouth daily. (Patient not taking: Reported  on 12/19/2019)   No facility-administered encounter medications on file as of 12/19/2019.    Surgical History: History reviewed. No pertinent surgical history.  Medical History: Past Medical History:  Diagnosis Date  . Allergic rhinitis   . Asthma   . Hyperlipidemia   . Hypertension   . Migraines     Family History: Family History  Problem Relation Age of Onset  . Alcohol abuse Mother   . Asthma Mother   . Coronary artery disease Mother   . Hypertension Mother   . Migraines Mother   . Alcohol abuse Father   . Asthma Father   . Hypertension Father   . Diabetes  Maternal Grandmother   . Diabetes Maternal Grandfather   . Diabetes Paternal Grandmother   . Diabetes Paternal Grandfather     Social History   Socioeconomic History  . Marital status: Married    Spouse name: Not on file  . Number of children: Not on file  . Years of education: Not on file  . Highest education level: Not on file  Occupational History  . Not on file  Tobacco Use  . Smoking status: Never Smoker  . Smokeless tobacco: Never Used  Substance and Sexual Activity  . Alcohol use: Never  . Drug use: Never  . Sexual activity: Not on file  Other Topics Concern  . Not on file  Social History Narrative  . Not on file   Social Determinants of Health   Financial Resource Strain:   . Difficulty of Paying Living Expenses: Not on file  Food Insecurity:   . Worried About Programme researcher, broadcasting/film/video in the Last Year: Not on file  . Ran Out of Food in the Last Year: Not on file  Transportation Needs:   . Lack of Transportation (Medical): Not on file  . Lack of Transportation (Non-Medical): Not on file  Physical Activity:   . Days of Exercise per Week: Not on file  . Minutes of Exercise per Session: Not on file  Stress:   . Feeling of Stress : Not on file  Social Connections:   . Frequency of Communication with Friends and Family: Not on file  . Frequency of Social Gatherings with Friends and Family: Not on file  . Attends Religious Services: Not on file  . Active Member of Clubs or Organizations: Not on file  . Attends Banker Meetings: Not on file  . Marital Status: Not on file  Intimate Partner Violence:   . Fear of Current or Ex-Partner: Not on file  . Emotionally Abused: Not on file  . Physically Abused: Not on file  . Sexually Abused: Not on file      Review of Systems  Constitutional: Negative for chills, fatigue and unexpected weight change.  HENT: Negative for congestion, rhinorrhea, sneezing and sore throat.   Eyes: Negative for photophobia, pain  and redness.  Respiratory: Negative for cough, chest tightness and shortness of breath.   Cardiovascular: Negative for chest pain and palpitations.  Gastrointestinal: Negative for abdominal pain, constipation, diarrhea, nausea and vomiting.  Endocrine: Negative.   Genitourinary: Negative for dysuria and frequency.  Musculoskeletal: Negative for arthralgias, back pain, joint swelling and neck pain.  Skin: Negative for rash.  Allergic/Immunologic: Negative.   Neurological: Negative for tremors and numbness.  Hematological: Negative for adenopathy. Does not bruise/bleed easily.  Psychiatric/Behavioral: Negative for behavioral problems and sleep disturbance. The patient is not nervous/anxious.     Vital Signs: BP 135/84   Resp  16   Ht 5' 1.25" (1.556 m)   Wt 150 lb (68 kg)   BMI 28.11 kg/m    Observation/Objective:  Well sounding, NAD ntoed   Assessment/Plan: 1. Intolerance to heat Encouraged patient to stay inside, and not expose herself to heat unless necessary. 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  2. Acute maxillary sinusitis, recurrence not specified Resolving, continue to follow.   3. Essential hypertension Controlled, continue current management.   General Counseling: Linda Rocha understanding of the findings of today's phone visit and agrees with plan of treatment. I have discussed any further diagnostic evaluation that may be needed or ordered today. We also reviewed her medications today. she has been encouraged to call the office with any questions or concerns that should arise related to todays visit.    No orders of the defined types were placed in this encounter.   No orders of the defined types were placed in this encounter.   Time spent: 25 Minutes    Blima Ledger [facility] Internal medicine

## 2019-12-22 ENCOUNTER — Other Ambulatory Visit: Payer: Self-pay

## 2019-12-22 MED ORDER — LEVOFLOXACIN 500 MG PO TABS: 500.0000 mg | ORAL_TABLET | Freq: Every day | ORAL | 0 refills | Status: DC

## 2019-12-22 NOTE — Telephone Encounter (Signed)
Pt called that she still having ear infections as per adam send levaquin for 7 days

## 2020-01-23 ENCOUNTER — Other Ambulatory Visit: Payer: Self-pay

## 2020-01-23 DIAGNOSIS — J301 Allergic rhinitis due to pollen: Secondary | ICD-10-CM

## 2020-01-23 MED ORDER — IBUPROFEN 800 MG PO TABS: 800.0000 mg | ORAL_TABLET | Freq: Three times a day (TID) | ORAL | 1 refills | Status: DC | PRN

## 2020-01-23 MED ORDER — MONTELUKAST SODIUM 10 MG PO TABS: 10.0000 mg | ORAL_TABLET | Freq: Every day | ORAL | 5 refills | Status: DC

## 2020-02-10 ENCOUNTER — Other Ambulatory Visit: Payer: Self-pay | Admitting: Adult Health

## 2020-02-10 DIAGNOSIS — G43909 Migraine, unspecified, not intractable, without status migrainosus: Secondary | ICD-10-CM

## 2020-02-22 ENCOUNTER — Other Ambulatory Visit: Payer: Self-pay

## 2020-02-22 DIAGNOSIS — E782 Mixed hyperlipidemia: Secondary | ICD-10-CM

## 2020-02-22 MED ORDER — ATORVASTATIN CALCIUM 10 MG PO TABS: 10.0000 mg | ORAL_TABLET | Freq: Every day | ORAL | 5 refills | Status: DC

## 2020-03-19 ENCOUNTER — Other Ambulatory Visit: Payer: Self-pay

## 2020-03-19 DIAGNOSIS — G43909 Migraine, unspecified, not intractable, without status migrainosus: Secondary | ICD-10-CM

## 2020-03-19 MED ORDER — ZOLMITRIPTAN 5 MG PO TABS: ORAL_TABLET | ORAL | 5 refills | Status: DC

## 2020-04-12 ENCOUNTER — Ambulatory Visit: Payer: BC Managed Care – PPO | Admitting: Nurse Practitioner

## 2020-04-30 ENCOUNTER — Other Ambulatory Visit: Payer: Self-pay

## 2020-04-30 DIAGNOSIS — I10 Essential (primary) hypertension: Secondary | ICD-10-CM

## 2020-04-30 MED ORDER — LISINOPRIL 40 MG PO TABS: 40.0000 mg | ORAL_TABLET | Freq: Every day | ORAL | 0 refills | Status: DC

## 2020-05-28 ENCOUNTER — Other Ambulatory Visit: Payer: Self-pay

## 2020-05-28 DIAGNOSIS — I10 Essential (primary) hypertension: Secondary | ICD-10-CM

## 2020-05-28 MED ORDER — LISINOPRIL 40 MG PO TABS: 40.0000 mg | ORAL_TABLET | Freq: Every day | ORAL | 0 refills | Status: DC

## 2020-05-28 NOTE — Telephone Encounter (Signed)
Spoke with pt need appt for refills she said she will call back make appt

## 2020-06-22 ENCOUNTER — Telehealth: Payer: Self-pay

## 2020-06-22 ENCOUNTER — Other Ambulatory Visit: Payer: Self-pay | Admitting: Nurse Practitioner

## 2020-06-22 ENCOUNTER — Other Ambulatory Visit: Payer: Self-pay | Admitting: Internal Medicine

## 2020-06-22 DIAGNOSIS — I10 Essential (primary) hypertension: Secondary | ICD-10-CM

## 2020-06-22 NOTE — Telephone Encounter (Signed)
Patient called requesting meds and appt for sinus infection, I gave her next available due to appt hx and she states she will see urgent care in the mean time. Linda Rocha

## 2020-06-25 ENCOUNTER — Other Ambulatory Visit: Payer: Self-pay

## 2020-06-25 DIAGNOSIS — E782 Mixed hyperlipidemia: Secondary | ICD-10-CM

## 2020-06-25 MED ORDER — ATORVASTATIN CALCIUM 10 MG PO TABS: 10.0000 mg | ORAL_TABLET | Freq: Every day | ORAL | 0 refills | Status: DC

## 2020-06-29 ENCOUNTER — Ambulatory Visit: Payer: BC Managed Care – PPO | Admitting: Physician Assistant

## 2020-07-19 ENCOUNTER — Other Ambulatory Visit: Payer: Self-pay

## 2020-07-19 ENCOUNTER — Encounter: Payer: Self-pay | Admitting: Physician Assistant

## 2020-07-19 ENCOUNTER — Ambulatory Visit: Payer: BC Managed Care – PPO | Admitting: Physician Assistant

## 2020-07-19 DIAGNOSIS — J0101 Acute recurrent maxillary sinusitis: Secondary | ICD-10-CM

## 2020-07-19 DIAGNOSIS — J4520 Mild intermittent asthma, uncomplicated: Secondary | ICD-10-CM

## 2020-07-19 DIAGNOSIS — G43909 Migraine, unspecified, not intractable, without status migrainosus: Secondary | ICD-10-CM | POA: Diagnosis not present

## 2020-07-19 DIAGNOSIS — I10 Essential (primary) hypertension: Secondary | ICD-10-CM | POA: Diagnosis not present

## 2020-07-19 DIAGNOSIS — M791 Myalgia, unspecified site: Secondary | ICD-10-CM

## 2020-07-19 DIAGNOSIS — R110 Nausea: Secondary | ICD-10-CM

## 2020-07-19 MED ORDER — CYCLOBENZAPRINE HCL 10 MG PO TABS: ORAL_TABLET | ORAL | 0 refills | Status: DC

## 2020-07-19 MED ORDER — ZOLMITRIPTAN 5 MG PO TABS: ORAL_TABLET | ORAL | 5 refills | Status: DC

## 2020-07-19 MED ORDER — PROMETHAZINE HCL 25 MG PO TABS
ORAL_TABLET | ORAL | 1 refills | Status: DC
Start: 2020-07-19 — End: 2020-10-04

## 2020-07-19 MED ORDER — CLARITHROMYCIN ER 500 MG PO TB24: ORAL_TABLET | ORAL | 0 refills | Status: DC

## 2020-07-19 NOTE — Progress Notes (Signed)
[facility] [address] [place], [place] [address]  Internal MEDICINE  Office Visit Note  Patient Name: Linda Rocha  [id number]  [id number]  Date of Service: 07/22/2020  Chief Complaint  Patient presents with  . Follow-up    Pt has sinus infection, started 2 weeks ago, she is unable to take the ABX that was sent in and so it has gotten worse causing headaches, refill request  . Hyperlipidemia  . Hypertension  . Asthma  . Quality Metric Gaps    Colonoscopy, pap    HPI Pt is here for a sick visit. -it has been a stressful school year. Migraines have been worse, uses zomig for acute, but needs refill. Feels more pain/tightness along back of neck into shoulders for which she will take a flexeril that helps some. She also gets very nauseous with her migraines and takes phenergan to help and needs a refill of this as well. Had previously seen neurology for migraines but not in a long time. Failed several preventive medications, most recently tried emgality but she stated it just made her gain wt and didn't help migraines. -She did a [facility] visit a month ago bc couldnt leave school and they prescribed biaxin ER but they only filled the normal biaxin which makes her sick. Pharmacist stated they didn't have the ER form so she tried the regular and felt terrible and couldn't take it. She has failed/has allergy to several antibiotics which is why she normally takes the Biaxin ER since she does well on it. Due to being unable to fill correct med she still has a sinus infection that's going on several weeks. The sinus infection is worsening her migraines and has been out of zomig. In addition to congestion her nose burns and her ears pop. Used to get these a lot but nasal spray and singulair has helped reduce frequency some. She has been seen by ENT in the past and was not interested in sinus surgery at the time. -She is followed by psych for her anxiety, depression, and  ADHD  Current Medication: Outpatient Encounter Medications as of 07/19/2020  Medication Sig  . clarithromycin (BIAXIN XL) 500 MG 24 hr tablet Take 1 tablet by mouth twice per day.  . albuterol (PROVENTIL HFA;VENTOLIN HFA) 108 (90 Base) MCG/ACT inhaler Inhale 2 puffs into the lungs every 6 (six) hours as needed for wheezing or shortness of breath.  . ALPRAZolam (XANAX) 1 MG tablet TK 1 T PO TID PRN  . amphetamine-dextroamphetamine (ADDERALL) 15 MG tablet Take 15 mg by mouth 3 (three) times daily. Dr Evelene Croon in [place]  . atorvastatin (LIPITOR) 10 MG tablet Take 1 tablet (10 mg total) by mouth daily.  Marland Kitchen buPROPion (WELLBUTRIN SR) 100 MG 12 hr tablet Take 100 mg by mouth daily.  Marland Kitchen buPROPion (WELLBUTRIN SR) 150 MG 12 hr tablet Take 150 mg by mouth 2 (two) times daily.  . cyclobenzaprine (FLEXERIL) 10 MG tablet TAKE 1 TABLET BY MOUTH THREE TIMES A DAY AS NEEDED FOR MUSCLE SPASMS  . ibuprofen (ADVIL) 800 MG tablet Take 1 tablet (800 mg total) by mouth 3 (three) times daily as needed.  Marland Kitchen levofloxacin (LEVAQUIN) 500 MG tablet Take 1 tablet (500 mg total) by mouth daily.  Marland Kitchen lisinopril (ZESTRIL) 40 MG tablet TAKE 1 TABLET BY MOUTH DAILY  . montelukast (SINGULAIR) 10 MG tablet Take 1 tablet (10 mg total) by mouth daily.  . promethazine (PHENERGAN) 25 MG tablet Take 1 tablet by mouth as needed for  nausea  . zolmitriptan (ZOMIG) 5 MG tablet TAKE ONE TABLET (5 MG) BY MOUTH AS NEEDED FOR MIGRAINE HEADACHE  . [DISCONTINUED] cyclobenzaprine (FLEXERIL) 10 MG tablet TAKE 1 TABLET BY MOUTH THREE TIMES A DAY AS NEEDED FOR MUSCLE SPASMS  . [DISCONTINUED] promethazine (PHENERGAN) 25 MG tablet Take 1 tablet by mouth as needed for nausea  . [DISCONTINUED] zolmitriptan (ZOMIG) 5 MG tablet TAKE ONE TABLET (5 MG) BY MOUTH AS NEEDED FOR MIGRAINE HEADACHE   No facility-administered encounter medications on file as of 07/19/2020.    Surgical History: History reviewed. No pertinent surgical history.  Medical  History: Past Medical History:  Diagnosis Date  . Allergic rhinitis   . Asthma   . Hyperlipidemia   . Hypertension   . Migraines     Family History: Family History  Problem Relation Age of Onset  . Alcohol abuse Mother   . Asthma Mother   . Coronary artery disease Mother   . Hypertension Mother   . Migraines Mother   . Alcohol abuse Father   . Asthma Father   . Hypertension Father   . Diabetes Maternal Grandmother   . Diabetes Maternal Grandfather   . Diabetes Paternal Grandmother   . Diabetes Paternal Grandfather     Social History   Socioeconomic History  . Marital status: Married    Spouse name: Not on file  . Number of children: Not on file  . Years of education: Not on file  . Highest education level: Not on file  Occupational History  . Not on file  Tobacco Use  . Smoking status: Never Smoker  . Smokeless tobacco: Never Used  Substance and Sexual Activity  . Alcohol use: Never  . Drug use: Never  . Sexual activity: Not on file  Other Topics Concern  . Not on file  Social History Narrative  . Not on file   Social Determinants of Health   Financial Resource Strain: Not on file  Food Insecurity: Not on file  Transportation Needs: Not on file  Physical Activity: Not on file  Stress: Not on file  Social Connections: Not on file  Intimate Partner Violence: Not on file      Review of Systems  Constitutional: Negative for fatigue and fever.  HENT: Positive for congestion, ear pain, postnasal drip, sinus pressure, sinus pain and sneezing. Negative for mouth sores and trouble swallowing.   Respiratory: Negative for cough.   Cardiovascular: Negative for chest pain.  Gastrointestinal: Positive for nausea.  Genitourinary: Negative for flank pain.  Musculoskeletal: Positive for arthralgias and neck stiffness.  Neurological: Positive for headaches.  Psychiatric/Behavioral: The patient is nervous/anxious.     Vital Signs: BP 114/88   Pulse (!) 103    Temp (!) 97.2 F (36.2 C)   Resp 16   Ht 5\' 1"  (1.549 m)   Wt 153 lb 6.4 oz (69.6 kg)   SpO2 97%   BMI 28.98 kg/m    Physical Exam Vitals and nursing note reviewed.  Constitutional:      General: She is not in acute distress.    Appearance: She is well-developed. She is not diaphoretic.  HENT:     Head: Normocephalic and atraumatic.     Nose: Congestion present.     Mouth/Throat:     Pharynx: No oropharyngeal exudate.  Eyes:     Pupils: Pupils are equal, round, and reactive to light.  Neck:     Thyroid: No thyromegaly.     Vascular: No JVD.  Trachea: No tracheal deviation.  Cardiovascular:     Rate and Rhythm: Normal rate and regular rhythm.     Heart sounds: Normal heart sounds. No murmur heard. No friction rub. No gallop.   Pulmonary:     Effort: Pulmonary effort is normal. No respiratory distress.     Breath sounds: No wheezing or rales.  Chest:     Chest wall: No tenderness.  Abdominal:     General: Bowel sounds are normal.     Palpations: Abdomen is soft.  Musculoskeletal:        General: Normal range of motion.     Cervical back: Normal range of motion and neck supple.  Lymphadenopathy:     Cervical: No cervical adenopathy.  Skin:    General: Skin is warm and dry.  Neurological:     Mental Status: She is alert and oriented to person, place, and time.     Cranial Nerves: No cranial nerve deficit.  Psychiatric:        Behavior: Behavior normal.        Thought Content: Thought content normal.        Judgment: Judgment normal.        Assessment/Plan: 1. Acute recurrent maxillary sinusitis Pt unable to tolerate other antibiotics will try to fill biaxin ER at different pharmacy. Continue nasal spay and singulair. May also try mucinex to help with congestion. - clarithromycin (BIAXIN XL) 500 MG 24 hr tablet; Take 1 tablet by mouth twice per day.  Dispense: 28 tablet; Refill: 0  2. Essential hypertension Well controlled, continue lisinopril. HR  initially elevated, but improved on exam--continue to monitor  3. Migraine without status migrainosus, not intractable, unspecified migraine type Refills of zomig, flexeril, and phergan sent. Referral to neurology placed for possible botox injections for migraine prevention due to failing several options and given additional neck tension and signs of occipital neuralgia with migraines. - Ambulatory referral to Neurology - cyclobenzaprine (FLEXERIL) 10 MG tablet; TAKE 1 TABLET BY MOUTH THREE TIMES A DAY AS NEEDED FOR MUSCLE SPASMS  Dispense: 90 tablet; Refill: 0 - zolmitriptan (ZOMIG) 5 MG tablet; TAKE ONE TABLET (5 MG) BY MOUTH AS NEEDED FOR MIGRAINE HEADACHE  Dispense: 16 tablet; Refill: 5 - promethazine (PHENERGAN) 25 MG tablet; Take 1 tablet by mouth as needed for nausea  Dispense: 30 tablet; Refill: 1  4. Myalgia May take flexeril as needed for muscle tension/spasms, referral placed to neurology for possible botox therapy for migraine prevention or trigger point injection for occipital neuralgia  - cyclobenzaprine (FLEXERIL) 10 MG tablet; TAKE 1 TABLET BY MOUTH THREE TIMES A DAY AS NEEDED FOR MUSCLE SPASMS  Dispense: 90 tablet; Refill: 0  5. Nausea - promethazine (PHENERGAN) 25 MG tablet; Take 1 tablet by mouth as needed for nausea  Dispense: 30 tablet; Refill: 1  6. Mild intermittent asthma without complication Stable, continue singulair, and inhaler as needed   General Counseling: Alisse verbalizes understanding of the findings of todays visit and agrees with plan of treatment. I have discussed any further diagnostic evaluation that may be needed or ordered today. We also reviewed her medications today. she has been encouraged to call the office with any questions or concerns that should arise related to todays visit.    Orders Placed This Encounter  Procedures  . Ambulatory referral to Neurology    Meds ordered this encounter  Medications  . clarithromycin (BIAXIN XL) 500 MG 24 hr  tablet    Sig: Take 1 tablet by mouth  twice per day.    Dispense:  28 tablet    Refill:  0    Pt requires 500mg  extended release tablets  . cyclobenzaprine (FLEXERIL) 10 MG tablet    Sig: TAKE 1 TABLET BY MOUTH THREE TIMES A DAY AS NEEDED FOR MUSCLE SPASMS    Dispense:  90 tablet    Refill:  0  . zolmitriptan (ZOMIG) 5 MG tablet    Sig: TAKE ONE TABLET (5 MG) BY MOUTH AS NEEDED FOR MIGRAINE HEADACHE    Dispense:  16 tablet    Refill:  5  . promethazine (PHENERGAN) 25 MG tablet    Sig: Take 1 tablet by mouth as needed for nausea    Dispense:  30 tablet    Refill:  1    This patient was seen by , PA-C in collaboration with Dr. Lynn Ito as a part of collaborative care agreement.   Total time spent:30 Minutes Time spent includes review of chart, medications, test results, and follow up plan with the patient.      Dr Beverely Risen Internal medicine

## 2020-07-24 ENCOUNTER — Other Ambulatory Visit: Payer: Self-pay | Admitting: Internal Medicine

## 2020-07-24 ENCOUNTER — Other Ambulatory Visit: Payer: Self-pay | Admitting: Nurse Practitioner

## 2020-07-24 DIAGNOSIS — I10 Essential (primary) hypertension: Secondary | ICD-10-CM

## 2020-07-24 DIAGNOSIS — E782 Mixed hyperlipidemia: Secondary | ICD-10-CM

## 2020-07-24 DIAGNOSIS — J301 Allergic rhinitis due to pollen: Secondary | ICD-10-CM

## 2020-07-25 ENCOUNTER — Other Ambulatory Visit: Payer: Self-pay | Admitting: Internal Medicine

## 2020-07-25 ENCOUNTER — Other Ambulatory Visit: Payer: Self-pay

## 2020-07-25 DIAGNOSIS — I10 Essential (primary) hypertension: Secondary | ICD-10-CM

## 2020-07-25 MED ORDER — LISINOPRIL 40 MG PO TABS: 40.0000 mg | ORAL_TABLET | Freq: Every day | ORAL | 3 refills | Status: DC

## 2020-08-28 ENCOUNTER — Other Ambulatory Visit: Payer: Self-pay | Admitting: Nurse Practitioner

## 2020-08-28 DIAGNOSIS — J301 Allergic rhinitis due to pollen: Secondary | ICD-10-CM

## 2020-09-01 ENCOUNTER — Other Ambulatory Visit: Payer: Self-pay | Admitting: Nurse Practitioner

## 2020-09-01 DIAGNOSIS — J301 Allergic rhinitis due to pollen: Secondary | ICD-10-CM

## 2020-09-03 ENCOUNTER — Other Ambulatory Visit: Payer: Self-pay | Admitting: Internal Medicine

## 2020-09-03 DIAGNOSIS — J301 Allergic rhinitis due to pollen: Secondary | ICD-10-CM

## 2020-10-04 ENCOUNTER — Other Ambulatory Visit: Payer: Self-pay | Admitting: Physician Assistant

## 2020-10-04 DIAGNOSIS — G43909 Migraine, unspecified, not intractable, without status migrainosus: Secondary | ICD-10-CM

## 2020-10-04 DIAGNOSIS — R110 Nausea: Secondary | ICD-10-CM

## 2020-10-10 ENCOUNTER — Telehealth: Payer: Self-pay | Admitting: Neurology

## 2020-10-10 ENCOUNTER — Ambulatory Visit: Payer: BC Managed Care – PPO | Admitting: Neurology

## 2020-10-10 ENCOUNTER — Encounter: Payer: Self-pay | Admitting: Neurology

## 2020-10-10 NOTE — Telephone Encounter (Signed)
Linda Rocha No-Showed new patient neurology appointment today.  If she calls back she can be rescheduled with any provider however please emphasize that we have many patients who are waiting for appointments and when patients do not show up at the loss of time we could have helped someone else.  Also let her know that if she were to no-show again or cancel within a limited amount of time she may be completely dismissed from our practice.

## 2020-10-18 ENCOUNTER — Ambulatory Visit: Payer: BC Managed Care – PPO | Admitting: Physician Assistant

## 2020-10-19 ENCOUNTER — Other Ambulatory Visit: Payer: Self-pay | Admitting: Nurse Practitioner

## 2020-10-19 DIAGNOSIS — G43909 Migraine, unspecified, not intractable, without status migrainosus: Secondary | ICD-10-CM

## 2020-10-24 ENCOUNTER — Other Ambulatory Visit: Payer: Self-pay | Admitting: Physician Assistant

## 2020-10-24 DIAGNOSIS — G43909 Migraine, unspecified, not intractable, without status migrainosus: Secondary | ICD-10-CM

## 2020-10-27 ENCOUNTER — Other Ambulatory Visit: Payer: Self-pay | Admitting: Nurse Practitioner

## 2020-10-27 DIAGNOSIS — E782 Mixed hyperlipidemia: Secondary | ICD-10-CM

## 2020-10-29 ENCOUNTER — Other Ambulatory Visit: Payer: Self-pay | Admitting: Internal Medicine

## 2020-10-29 DIAGNOSIS — E782 Mixed hyperlipidemia: Secondary | ICD-10-CM

## 2020-10-30 ENCOUNTER — Ambulatory Visit: Payer: BC Managed Care – PPO | Admitting: Internal Medicine

## 2020-11-19 ENCOUNTER — Other Ambulatory Visit: Payer: Self-pay | Admitting: Internal Medicine

## 2020-11-19 DIAGNOSIS — I10 Essential (primary) hypertension: Secondary | ICD-10-CM

## 2020-11-19 DIAGNOSIS — G43909 Migraine, unspecified, not intractable, without status migrainosus: Secondary | ICD-10-CM

## 2020-11-22 ENCOUNTER — Other Ambulatory Visit: Payer: Self-pay | Admitting: Internal Medicine

## 2020-11-22 ENCOUNTER — Ambulatory Visit: Payer: BC Managed Care – PPO | Admitting: Physician Assistant

## 2020-11-22 DIAGNOSIS — R110 Nausea: Secondary | ICD-10-CM

## 2020-11-22 DIAGNOSIS — G43909 Migraine, unspecified, not intractable, without status migrainosus: Secondary | ICD-10-CM

## 2020-11-26 ENCOUNTER — Other Ambulatory Visit: Payer: Self-pay | Admitting: Internal Medicine

## 2020-11-26 DIAGNOSIS — G43909 Migraine, unspecified, not intractable, without status migrainosus: Secondary | ICD-10-CM

## 2020-11-28 ENCOUNTER — Telehealth: Payer: Self-pay

## 2020-11-28 ENCOUNTER — Other Ambulatory Visit: Payer: Self-pay | Admitting: Internal Medicine

## 2020-11-28 DIAGNOSIS — G43909 Migraine, unspecified, not intractable, without status migrainosus: Secondary | ICD-10-CM

## 2020-11-28 NOTE — Telephone Encounter (Signed)
Pt called need refills for zomig as per dr Welton Flakes send and advised need to keep follow up

## 2020-12-03 ENCOUNTER — Ambulatory Visit: Payer: BC Managed Care – PPO | Admitting: Nurse Practitioner

## 2020-12-07 ENCOUNTER — Ambulatory Visit: Payer: BC Managed Care – PPO | Admitting: Nurse Practitioner

## 2020-12-17 ENCOUNTER — Encounter: Payer: Self-pay | Admitting: Nurse Practitioner

## 2020-12-17 ENCOUNTER — Ambulatory Visit: Payer: BC Managed Care – PPO | Admitting: Nurse Practitioner

## 2020-12-17 ENCOUNTER — Other Ambulatory Visit: Payer: Self-pay

## 2020-12-17 VITALS — BP 112/74 | HR 98 | Temp 97.1°F | Ht 61.5 in | Wt 163.1 lb

## 2020-12-17 DIAGNOSIS — G43909 Migraine, unspecified, not intractable, without status migrainosus: Secondary | ICD-10-CM | POA: Diagnosis not present

## 2020-12-17 DIAGNOSIS — I10 Essential (primary) hypertension: Secondary | ICD-10-CM | POA: Diagnosis not present

## 2020-12-17 DIAGNOSIS — Z7689 Persons encountering health services in other specified circumstances: Secondary | ICD-10-CM | POA: Diagnosis not present

## 2020-12-17 DIAGNOSIS — G44209 Tension-type headache, unspecified, not intractable: Secondary | ICD-10-CM

## 2020-12-17 DIAGNOSIS — J0101 Acute recurrent maxillary sinusitis: Secondary | ICD-10-CM

## 2020-12-17 MED ORDER — ZOLMITRIPTAN 5 MG PO TABS: ORAL_TABLET | ORAL | 2 refills | Status: DC

## 2020-12-17 MED ORDER — CLARITHROMYCIN ER 500 MG PO TB24: 500.0000 mg | ORAL_TABLET | Freq: Two times a day (BID) | ORAL | 0 refills | Status: DC

## 2020-12-17 MED ORDER — TIZANIDINE HCL 4 MG PO TABS: ORAL_TABLET | ORAL | 2 refills | Status: DC

## 2020-12-17 MED ORDER — LISINOPRIL 40 MG PO TABS: 40.0000 mg | ORAL_TABLET | Freq: Every day | ORAL | 1 refills | Status: DC

## 2020-12-17 MED ORDER — EMGALITY 120 MG/ML ~~LOC~~ SOAJ: 120.0000 mg | SUBCUTANEOUS | 2 refills | Status: DC

## 2020-12-17 NOTE — Progress Notes (Signed)
New Patient Office Visit  Subjective:  Patient ID: Linda Rocha, female    DOB: 1975/06/24  Age: 45 y.o. MRN: [id number]  CC:  Chief Complaint  Patient presents with   New Patient (Initial Visit)    HPI Linda Rocha presents to establish new primary care provider.  She has a long history of migraine headaches.  Often triggered by tension.  She states that she frequently runs out her Zomig before the end of the month.  She states she wants to start being active again in the form of running.  She she states that she is terrified to start running because that can cause tension and would potentially run out of migraine medications too soon.  She does need a refill of her Zomig.  She states that often muscle relaxers have helped to relieve migraine headaches.  Has taken Flexeril in the past with this though this does not seem to be helping so much anymore.  She does take verapamil for migraine prevention as well as blood pressure control.  Blood pressure is doing very well today. She also states that she has been on Emgality once monthly to prevent migraine headaches.  She states this has been very effective also.  Would like to restart.  She backtrack she is now She also has a history of chronic sinusitis.  When she gets sinus infections, they can be resistant to treatment.  We have found that a longer term course of Biaxin XL twice daily treats sinus infections the best for her.  Environmental allergies seem to bring on sinus infections for her.  She states she has been taking Mucinex DM, Advil allergy, and using and nasal rinse daily to prevent sinus infections before they occur.  She states this has been effective for the most part.  The past week, she has noticed some soreness in the frontal bones and behind that the orbits.  This is usually the way sinus infections begin for her.  She does have some mild nasal congestion.  She denies fever, chills, or body aches.  She denies sore throat.   She denies nausea, vomiting, or diarrhea. She is due for a full physical exam.  She does need to have fasting blood work.  She does see gynecology for well woman care and mammograms.  Past Medical History:  Diagnosis Date   Allergic rhinitis    Asthma    Hyperlipidemia    Hypertension    Migraines     No past surgical history on file.  Family History  Problem Relation Age of Onset   Alcohol abuse Mother    Asthma Mother    Coronary artery disease Mother    Hypertension Mother    Migraines Mother    Alcohol abuse Father    Asthma Father    Hypertension Father    Diabetes Maternal Grandmother    Diabetes Maternal Grandfather    Diabetes Paternal Grandmother    Diabetes Paternal Grandfather     Social History   Socioeconomic History   Marital status: Married    Spouse name: Not on file   Number of children: Not on file   Years of education: Not on file   Highest education level: Not on file  Occupational History   Not on file  Tobacco Use   Smoking status: Never   Smokeless tobacco: Never  Substance and Sexual Activity   Alcohol use: Never   Drug use: Never   Sexual activity: Yes  Other  Topics Concern   Not on file  Social History Narrative   Not on file   Social Determinants of Health   Financial Resource Strain: Not on file  Food Insecurity: Not on file  Transportation Needs: Not on file  Physical Activity: Not on file  Stress: Not on file  Social Connections: Not on file  Intimate Partner Violence: Not on file    ROS Review of Systems  Constitutional:  Negative for activity change, appetite change, chills, fatigue and fever.  HENT:  Positive for rhinorrhea, sinus pressure and sinus pain. Negative for congestion, postnasal drip, sneezing and sore throat.   Eyes: Negative.   Respiratory:  Negative for cough, chest tightness, shortness of breath and wheezing.   Cardiovascular:  Negative for chest pain and palpitations.  Gastrointestinal:  Negative  for abdominal pain, constipation, diarrhea, nausea and vomiting.  Endocrine: Negative for cold intolerance, heat intolerance, polydipsia and polyuria.  Genitourinary:  Negative for dyspareunia, dysuria, flank pain, frequency and urgency.  Musculoskeletal:  Positive for myalgias. Negative for arthralgias and back pain.  Skin:  Negative for rash.  Allergic/Immunologic: Positive for environmental allergies.  Neurological:  Positive for headaches. Negative for dizziness and weakness.  Hematological:  Negative for adenopathy.  Psychiatric/Behavioral:  The patient is not nervous/anxious.    Objective:   Today's Vitals   12/17/20 1610  BP: 112/74  Pulse: 98  Temp: (!) 97.1 F (36.2 C)  SpO2: 100%  Weight: 163 lb 1.6 oz (74 kg)  Height: 5' 1.5" (1.562 m)   Body mass index is 30.32 kg/m.   Physical Exam Vitals and nursing note reviewed.  Constitutional:      Appearance: Normal appearance. She is well-developed.  HENT:     Head: Normocephalic and atraumatic.     Right Ear: Tympanic membrane, ear canal and external ear normal.     Left Ear: Tympanic membrane, ear canal and external ear normal.     Nose: Congestion present.     Right Sinus: Frontal sinus tenderness present.     Left Sinus: Frontal sinus tenderness present.  Eyes:     Pupils: Pupils are equal, round, and reactive to light.  Cardiovascular:     Rate and Rhythm: Normal rate and regular rhythm.     Pulses: Normal pulses.     Heart sounds: Normal heart sounds.  Pulmonary:     Effort: Pulmonary effort is normal.     Breath sounds: Normal breath sounds.  Abdominal:     Palpations: Abdomen is soft.  Musculoskeletal:        General: Normal range of motion.     Cervical back: Normal range of motion and neck supple.  Lymphadenopathy:     Cervical: No cervical adenopathy.  Skin:    General: Skin is warm and dry.     Capillary Refill: Capillary refill takes less than 2 seconds.  Neurological:     General: No focal  deficit present.     Mental Status: She is alert and oriented to person, place, and time.  Psychiatric:        Mood and Affect: Mood normal.        Behavior: Behavior normal.        Thought Content: Thought content normal.        Judgment: Judgment normal.    Assessment & Plan:  1. Encounter to establish care Appointment today to establish new primary care provider.  2. Acute recurrent maxillary sinusitis Will start Biaxin XL 500 mg tablets twice  daily for 14 days.  Advised her to continue with Mucinex DM, Advil allergy, and sinus rinses every day.  She will notify the office if symptoms worsen or do not improve.  3. Essential hypertension Blood pressure stable.  Continue lisinopril daily. - lisinopril (ZESTRIL) 40 MG tablet; Take 1 tablet (40 mg total) by mouth daily.  Dispense: 90 tablet; Refill: 1  4. Migraine without status migrainosus, not intractable, unspecified migraine type Restart Emgality 120 mg monthly.  He is to make as needed and as prescribed.  New prescriptions were sent to her pharmacy for both. - zolmitriptan (ZOMIG) 5 MG tablet; Take 1 tablet at onset of migraine headache. May repeat dose in 2 hours for persistent migraines  Dispense: 16 tablet; Refill: 2 - Galcanezumab-gnlm (EMGALITY) 120 MG/ML SOAJ; Inject 120 mg into the skin every 30 (thirty) days.  Dispense: 1.12 mL; Refill: 2  5. Muscle tension headache Trial of tizanidine 4 mg tablets.  May take 1/2 to 1 tablet daily as needed for muscle tension which seems to contribute to migraine headaches.  We will reassess at next visit. - tiZANidine (ZANAFLEX) 4 MG tablet; Take 1/2 to 1 tablet daily as needed for muscle tension  Dispense: 30 tablet; Refill: 2   Problem List Items Addressed This Visit       Cardiovascular and Mediastinum   Migraine without status migrainosus, not intractable   Relevant Medications   lisinopril (ZESTRIL) 40 MG tablet   zolmitriptan (ZOMIG) 5 MG tablet   tiZANidine (ZANAFLEX) 4 MG  tablet   Galcanezumab-gnlm (EMGALITY) 120 MG/ML SOAJ   Essential hypertension   Relevant Medications   lisinopril (ZESTRIL) 40 MG tablet     Respiratory   Acute maxillary sinusitis     Other   Encounter to establish care - Primary   Muscle tension headache   Relevant Medications   zolmitriptan (ZOMIG) 5 MG tablet   tiZANidine (ZANAFLEX) 4 MG tablet   Galcanezumab-gnlm (EMGALITY) 120 MG/ML SOAJ    Outpatient Encounter Medications as of 12/17/2020  Medication Sig   ALPRAZolam (XANAX) 1 MG tablet TK 1 T PO TID PRN   amphetamine-dextroamphetamine (ADDERALL) 15 MG tablet Take 15 mg by mouth 3 (three) times daily. Dr Evelene [place]onKaur [place]   atorvastatin (LIPITOR) 10 MG tablet TAKE 1 TABLET BY MOUTH DAILY.   buPROPion (WELLBUTRIN SR) 100 MG 12 hr tablet Take 100 mg by mouth daily.   buPROPion (WELLBUTRIN SR) 150 MG 12 hr tablet Take 150 mg by mouth 2 (two) times daily.   Galcanezumab-gnlm (EMGALITY) 120 MG/ML SOAJ Inject 120 mg into the skin every 30 (thirty) days.   ibuprofen (ADVIL) 800 MG tablet TAKE 1 TABLET BY MOUTH THREE TIMES DAILYAS NEEDED   montelukast (SINGULAIR) 10 MG tablet TAKE ONE TABLET BY MOUTH EVERY DAY   promethazine (PHENERGAN) 25 MG tablet TAKE ONE TABLET AS NEEDED FOR NAUSEA   tiZANidine (ZANAFLEX) 4 MG tablet Take 1/2 to 1 tablet daily as needed for muscle tension   [DISCONTINUED] clarithromycin (BIAXIN XL) 500 MG 24 hr tablet Take 1 tablet (500 mg total) by mouth 2 (two) times daily.   [DISCONTINUED] cyclobenzaprine (FLEXERIL) 10 MG tablet TAKE 1 TABLET BY MOUTH THREE TIMES A DAY AS NEEDED FOR MUSCLE SPASMS   [DISCONTINUED] lisinopril (ZESTRIL) 40 MG tablet TAKE 1 TABLET BY MOUTH DAILY   [DISCONTINUED] zolmitriptan (ZOMIG) 5 MG tablet TAKE ONE TABLET AS NEEDED FOR MIGRAINE HEADACHE   lisinopril (ZESTRIL) 40 MG tablet Take 1 tablet (40 mg total) by mouth  daily.   zolmitriptan (ZOMIG) 5 MG tablet Take 1 tablet at onset of migraine headache. May repeat dose in 2 hours for  persistent migraines   [DISCONTINUED] albuterol (PROVENTIL HFA;VENTOLIN HFA) 108 (90 Base) MCG/ACT inhaler Inhale 2 puffs into the lungs every 6 (six) hours as needed for wheezing or shortness of breath.   [DISCONTINUED] clarithromycin (BIAXIN XL) 500 MG 24 hr tablet Take 1 tablet by mouth twice per day.   [DISCONTINUED] levofloxacin (LEVAQUIN) 500 MG tablet Take 1 tablet (500 mg total) by mouth daily.   No facility-administered encounter medications on file as of 12/17/2020.   This note was dictated using Conservation officer, historic buildings. Rapid proofreading was performed to expedite the delivery of the information. Despite proofreading, phonetic errors will occur which are common with this voice recognition software. Please take this into consideration. If there are any concerns, please contact our office.    Follow-up: Return in about 1 week (around 12/24/2020) for health maintenance exam - FBW with Free T4 and vitamin d at time of visit. Marland Kitchen   Carlean Jews, NP

## 2020-12-19 ENCOUNTER — Telehealth: Payer: Self-pay

## 2020-12-19 NOTE — Telephone Encounter (Signed)
Returned records request with note stating records in Epic to [facility]  Fax# [phone] 

## 2020-12-20 ENCOUNTER — Other Ambulatory Visit: Payer: Self-pay | Admitting: Internal Medicine

## 2020-12-20 ENCOUNTER — Other Ambulatory Visit: Payer: Self-pay

## 2020-12-20 ENCOUNTER — Telehealth: Payer: Self-pay | Admitting: Nurse Practitioner

## 2020-12-20 DIAGNOSIS — R5383 Other fatigue: Secondary | ICD-10-CM

## 2020-12-20 DIAGNOSIS — R110 Nausea: Secondary | ICD-10-CM

## 2020-12-20 DIAGNOSIS — J0101 Acute recurrent maxillary sinusitis: Secondary | ICD-10-CM

## 2020-12-20 DIAGNOSIS — E039 Hypothyroidism, unspecified: Secondary | ICD-10-CM

## 2020-12-20 DIAGNOSIS — G43909 Migraine, unspecified, not intractable, without status migrainosus: Secondary | ICD-10-CM

## 2020-12-20 MED ORDER — CLARITHROMYCIN ER 500 MG PO TB24: 500.0000 mg | ORAL_TABLET | Freq: Two times a day (BID) | ORAL | 0 refills | Status: DC

## 2020-12-20 NOTE — Telephone Encounter (Signed)
Antibiotic has been resent to [facility] in [place] on [address].

## 2020-12-20 NOTE — Telephone Encounter (Signed)
Thank you :)

## 2020-12-20 NOTE — Telephone Encounter (Signed)
Patient needs Biaxin XL prescription sent to [facility] [address], [place], [place].  Prescription was sent yesterday to [facility] and they don't have the Biaxin XL. They have the regular, but not the XL.  Patient needs the XL.

## 2020-12-22 ENCOUNTER — Other Ambulatory Visit: Payer: Self-pay | Admitting: Nurse Practitioner

## 2020-12-22 DIAGNOSIS — J0101 Acute recurrent maxillary sinusitis: Secondary | ICD-10-CM

## 2020-12-24 DIAGNOSIS — G44209 Tension-type headache, unspecified, not intractable: Secondary | ICD-10-CM | POA: Insufficient documentation

## 2020-12-24 DIAGNOSIS — Z0001 Encounter for general adult medical examination with abnormal findings: Secondary | ICD-10-CM | POA: Insufficient documentation

## 2020-12-24 DIAGNOSIS — Z7689 Persons encountering health services in other specified circumstances: Secondary | ICD-10-CM | POA: Insufficient documentation

## 2020-12-25 ENCOUNTER — Other Ambulatory Visit: Payer: BC Managed Care – PPO

## 2020-12-25 ENCOUNTER — Other Ambulatory Visit: Payer: Self-pay | Admitting: Nurse Practitioner

## 2020-12-25 ENCOUNTER — Other Ambulatory Visit: Payer: Self-pay

## 2020-12-25 DIAGNOSIS — E039 Hypothyroidism, unspecified: Secondary | ICD-10-CM

## 2020-12-25 DIAGNOSIS — R5383 Other fatigue: Secondary | ICD-10-CM

## 2020-12-25 DIAGNOSIS — J0101 Acute recurrent maxillary sinusitis: Secondary | ICD-10-CM

## 2020-12-25 MED ORDER — CLARITHROMYCIN ER 500 MG PO TB24: 1000.0000 mg | ORAL_TABLET | Freq: Every day | ORAL | 0 refills | Status: DC

## 2020-12-25 NOTE — Telephone Encounter (Signed)
Changed current prescription for clarithromycin 500mg  BID to 1000mg  daiy for next 10 days. Sent new rx to [facility]. Please advise patient of change. It will still be two tablets daily. If she wants to split into two separate doses that would be fine. Thanks.

## 2020-12-25 NOTE — Telephone Encounter (Signed)
Sorry. I didn't realize that was all I had to do for this.

## 2020-12-25 NOTE — Telephone Encounter (Signed)
I changed the prescription in her chart and sent it to the pharmacy already. It is now for 2 tablets po QD for 5 days and sent new rx for 10 tablets. They can d/c this prescription completely. I have already d/c'd it from her chart.

## 2020-12-25 NOTE — Progress Notes (Signed)
Changed current prescription for clarithromycin 500mg BID to 1000mg daiy for next 10 days. Sent new rx to [facility]. Please advise patient of change. It will still be two tablets daily. If she wants to split into two separate doses that would be fine. Thanks.

## 2020-12-26 ENCOUNTER — Ambulatory Visit (INDEPENDENT_AMBULATORY_CARE_PROVIDER_SITE_OTHER): Payer: BC Managed Care – PPO | Admitting: Nurse Practitioner

## 2020-12-26 ENCOUNTER — Encounter: Payer: Self-pay | Admitting: Nurse Practitioner

## 2020-12-26 VITALS — BP 103/68 | HR 95 | Temp 98.6°F | Ht 61.5 in | Wt 160.1 lb

## 2020-12-26 DIAGNOSIS — E782 Mixed hyperlipidemia: Secondary | ICD-10-CM | POA: Diagnosis not present

## 2020-12-26 DIAGNOSIS — Z0001 Encounter for general adult medical examination with abnormal findings: Secondary | ICD-10-CM | POA: Diagnosis not present

## 2020-12-26 DIAGNOSIS — G43909 Migraine, unspecified, not intractable, without status migrainosus: Secondary | ICD-10-CM

## 2020-12-26 DIAGNOSIS — I10 Essential (primary) hypertension: Secondary | ICD-10-CM

## 2020-12-26 DIAGNOSIS — E559 Vitamin D deficiency, unspecified: Secondary | ICD-10-CM

## 2020-12-26 DIAGNOSIS — E039 Hypothyroidism, unspecified: Secondary | ICD-10-CM | POA: Diagnosis not present

## 2020-12-26 LAB — T4, FREE: Free T4: 0.95 ng/dL (ref 0.82–1.77)

## 2020-12-26 LAB — VITAMIN D 25 HYDROXY (VIT D DEFICIENCY, FRACTURES): Vit D, 25-Hydroxy: 18.1 ng/mL — ABNORMAL LOW (ref 30.0–100.0)

## 2020-12-26 MED ORDER — ERGOCALCIFEROL 1.25 MG (50000 UT) PO CAPS: 50000.0000 [IU] | ORAL_CAPSULE | ORAL | 5 refills | Status: DC

## 2020-12-26 NOTE — Progress Notes (Signed)
Established Patient Office Visit  Subjective:  Patient ID: Linda Rocha, female    DOB: June 10, 1975  Age: 45 y.o. MRN: [id number]  CC:  Chief Complaint  Patient presents with   Annual Exam    HPI Linda Rocha presents for annual wellness visit.  She did have vitamin D level drawn.  She is vitamin D deficient.  She still needs to have routine, fasting labs.  She does take atorvastatin for cholesterol.  This level needs to be checked so that dosing of medication can be adjusted based on current lab results.  Her blood pressure is well maintained on current medications.  She does see a GYN provider for Pap smears and mammograms.  We restarted Emgality at her last visit to help with migraine prophylaxis.  States he is doing well with this.  She does take Zomig for acute migraines.  This does help. She has no new concerns or complaints.  She denies chest pain, chest pressure, or shortness of breath. She denies headaches or visual disturbances. She denies abdominal pain, nausea, vomiting, or changes in bowel or bladder habits.    Past Medical History:  Diagnosis Date   Allergic rhinitis    Asthma    Hyperlipidemia    Hypertension    Migraines     History reviewed. No pertinent surgical history.  Family History  Problem Relation Age of Onset   Alcohol abuse Mother    Asthma Mother    Coronary artery disease Mother    Hypertension Mother    Migraines Mother    Alcohol abuse Father    Asthma Father    Hypertension Father    Diabetes Maternal Grandmother    Diabetes Maternal Grandfather    Diabetes Paternal Grandmother    Diabetes Paternal Grandfather     Social History   Socioeconomic History   Marital status: Married    Spouse name: Not on file   Number of children: Not on file   Years of education: Not on file   Highest education level: Not on file  Occupational History   Not on file  Tobacco Use   Smoking status: Never   Smokeless tobacco: Never  Substance  and Sexual Activity   Alcohol use: Never   Drug use: Never   Sexual activity: Yes  Other Topics Concern   Not on file  Social History Narrative   Not on file   Social Determinants of Health   Financial Resource Strain: Not on file  Food Insecurity: Not on file  Transportation Needs: Not on file  Physical Activity: Not on file  Stress: Not on file  Social Connections: Not on file  Intimate Partner Violence: Not on file    Outpatient Medications Prior to Visit  Medication Sig Dispense Refill   ALPRAZolam (XANAX) 1 MG tablet TK 1 T PO TID PRN  0   amphetamine-dextroamphetamine (ADDERALL) 15 MG tablet Take 15 mg by mouth 3 (three) times daily. Dr Evelene Croon in [place]     atorvastatin (LIPITOR) 10 MG tablet TAKE 1 TABLET BY MOUTH DAILY. 30 tablet 3   buPROPion (WELLBUTRIN SR) 100 MG 12 hr tablet Take 100 mg by mouth daily.  12   buPROPion (WELLBUTRIN SR) 150 MG 12 hr tablet Take 150 mg by mouth 2 (two) times daily.  7   Galcanezumab-gnlm (EMGALITY) 120 MG/ML SOAJ Inject 120 mg into the skin every 30 (thirty) days. 1.12 mL 2   ibuprofen (ADVIL) 800 MG tablet TAKE 1 TABLET BY  MOUTH THREE TIMES DAILYAS NEEDED 90 tablet 1   lisinopril (ZESTRIL) 40 MG tablet Take 1 tablet (40 mg total) by mouth daily. 90 tablet 1   montelukast (SINGULAIR) 10 MG tablet TAKE ONE TABLET BY MOUTH EVERY DAY 30 tablet 5   promethazine (PHENERGAN) 25 MG tablet TAKE ONE TABLET AS NEEDED FOR NAUSEA 30 tablet 1   tiZANidine (ZANAFLEX) 4 MG tablet Take 1/2 to 1 tablet daily as needed for muscle tension 30 tablet 2   zolmitriptan (ZOMIG) 5 MG tablet Take 1 tablet at onset of migraine headache. May repeat dose in 2 hours for persistent migraines 16 tablet 2   clarithromycin (BIAXIN XL) 500 MG 24 hr tablet Take 2 tablets (1,000 mg total) by mouth daily. 10 tablet 0   No facility-administered medications prior to visit.    Allergies  Allergen Reactions   Sulfa Antibiotics Anaphylaxis    ROS Review of Systems   Constitutional:  Negative for appetite change, chills, fatigue and fever.  HENT:  Positive for congestion, sinus pressure and sinus pain. Negative for postnasal drip, rhinorrhea, sneezing and sore throat.   Eyes: Negative.   Respiratory:  Negative for cough, chest tightness, shortness of breath and wheezing.        States that her asthma has been well managed.  Has rarely needed to use rescue inhaler.  Cardiovascular:  Negative for chest pain and palpitations.  Gastrointestinal:  Negative for abdominal pain, constipation, diarrhea, nausea and vomiting.  Endocrine: Negative for cold intolerance, heat intolerance, polydipsia and polyuria.  Genitourinary:  Negative for dyspareunia, dysuria, flank pain, frequency and urgency.  Musculoskeletal:  Positive for myalgias. Negative for arthralgias and back pain.  Skin:  Negative for rash.  Allergic/Immunologic: Positive for environmental allergies.  Neurological:  Positive for headaches. Negative for dizziness and weakness.  Hematological:  Negative for adenopathy.  Psychiatric/Behavioral:  Positive for dysphoric mood. The patient is nervous/anxious.        Patient routinely sees psychiatry.     Objective:    Physical Exam Vitals and nursing note reviewed.  Constitutional:      Appearance: Normal appearance. She is well-developed.  HENT:     Head: Normocephalic and atraumatic.     Right Ear: Tympanic membrane, ear canal and external ear normal.     Left Ear: Tympanic membrane, ear canal and external ear normal.     Nose: Congestion present.     Right Sinus: Maxillary sinus tenderness and frontal sinus tenderness present.     Left Sinus: Maxillary sinus tenderness and frontal sinus tenderness present.     Mouth/Throat:     Lips: Pink.     Mouth: Mucous membranes are moist.     Pharynx: Oropharynx is clear.  Eyes:     Extraocular Movements: Extraocular movements intact.     Conjunctiva/sclera: Conjunctivae normal.     Pupils: Pupils are  equal, round, and reactive to light.  Neck:     Vascular: No carotid bruit.  Cardiovascular:     Rate and Rhythm: Normal rate and regular rhythm.     Pulses: Normal pulses.     Heart sounds: Normal heart sounds.  Pulmonary:     Effort: Pulmonary effort is normal.     Breath sounds: Normal breath sounds.  Abdominal:     General: Bowel sounds are normal.     Palpations: Abdomen is soft.  Musculoskeletal:        General: Normal range of motion.     Cervical back: Normal range  of motion and neck supple.  Lymphadenopathy:     Cervical: No cervical adenopathy.  Skin:    General: Skin is warm and dry.     Capillary Refill: Capillary refill takes less than 2 seconds.  Neurological:     General: No focal deficit present.     Mental Status: She is alert and oriented to person, place, and time.  Psychiatric:        Mood and Affect: Mood normal.        Behavior: Behavior normal.        Thought Content: Thought content normal.        Judgment: Judgment normal.   Today's Vitals   12/26/20 1604  BP: 103/68  Pulse: 95  Temp: 98.6 F (37 C)  SpO2: 98%  Weight: 160 lb 1.6 oz (72.6 kg)  Height: 5' 1.5" (1.562 m)   Body mass index is 29.76 kg/m.   Wt Readings from Last 3 Encounters:  12/26/20 160 lb 1.6 oz (72.6 kg)  12/17/20 163 lb 1.6 oz (74 kg)  07/19/20 153 lb 6.4 oz (69.6 kg)     Health Maintenance Due  Topic Date Due   HIV Screening  Never done   TETANUS/TDAP  Never done   COLONOSCOPY (Pts 45-6yrs Insurance coverage will need to be confirmed)  Never done   PAP SMEAR-Modifier  08/19/2020   INFLUENZA VACCINE  Never done    There are no preventive care reminders to display for this patient.  No results found for: TSH Lab Results  Component Value Date   WBC 5.9 09/24/2011   HGB 12.0 09/24/2011   HCT 35.8 09/24/2011   MCV 91 09/24/2011   PLT 248 09/24/2011   Lab Results  Component Value Date   NA 142 09/24/2011   K 3.6 09/24/2011   CO2 26 09/24/2011   GLUCOSE  80 09/24/2011   BUN 7 09/24/2011   CREATININE 0.75 09/24/2011   BILITOT 0.1 (L) 09/24/2011   ALKPHOS 62 09/24/2011   AST 20 09/24/2011   ALT 17 09/24/2011   PROT 7.4 09/24/2011   ALBUMIN 3.6 09/24/2011   CALCIUM 8.9 09/24/2011   ANIONGAP 9 09/24/2011   No results found for: CHOL No results found for: HDL No results found for: LDLCALC No results found for: TRIG No results found for: CHOLHDL No results found for: PJKD3O    Assessment & Plan:  1. Encounter for general adult medical examination with abnormal findings Presents today for annual wellness visit.  2. Essential hypertension Blood pressure well managed.  Continue medications as prescribed.  3. Hypothyroidism, unspecified type Will check thyroid panel and treat hypothyroid as indicated.  Currently not on medications.  4. Mixed hyperlipidemia Check fasting lipid panel.  Adjust atorvastatin dosing as indicated.    5. Migraine without status migrainosus, not intractable, unspecified migraine type Patient doing well with dose of Emgality.  Continue monthly.  He is on make as needed and as prescribed for acute migraines.  6. Vitamin D deficiency Vitamin D deficient.  Minna treat with Drisdol 50,000 international unit weekly. - ergocalciferol (DRISDOL) 1.25 MG (50000 UT) capsule; Take 1 capsule (50,000 Units total) by mouth once a week.  Dispense: 4 capsule; Refill: 5   Problem List Items Addressed This Visit       Cardiovascular and Mediastinum   Migraine without status migrainosus, not intractable   Essential hypertension     Endocrine   Hypothyroidism     Other   Mixed hyperlipidemia   Encounter  for general adult medical examination with abnormal findings - Primary   Vitamin D deficiency   Relevant Medications   ergocalciferol (DRISDOL) 1.25 MG (50000 UT) capsule    Meds ordered this encounter  Medications   ergocalciferol (DRISDOL) 1.25 MG (50000 UT) capsule    Sig: Take 1 capsule (50,000 Units total)  by mouth once a week.    Dispense:  4 capsule    Refill:  5    Order Specific Question:   Supervising Provider    Answer:   Nani GasserMETHENEY, CATHERINE D [[id number]]   This note was dictated using Dragon Voice Recognition Software. Rapid proofreading was performed to expedite the delivery of the information. Despite proofreading, phonetic errors will occur which are common with this voice recognition software. Please take this into consideration. If there are any concerns, please contact our office.    Follow-up: Return in about 6 months (around 06/25/2021) for blood pressure, migraines.    Carlean JewsHeather E Nadelyn Enriques, NP

## 2020-12-26 NOTE — Progress Notes (Signed)
Vitamin d deficient. Will discuss at visit. Waiting on other lab results.

## 2021-01-07 ENCOUNTER — Other Ambulatory Visit: Payer: Self-pay | Admitting: Nurse Practitioner

## 2021-01-07 ENCOUNTER — Telehealth: Payer: Self-pay | Admitting: Nurse Practitioner

## 2021-01-07 DIAGNOSIS — J0101 Acute recurrent maxillary sinusitis: Secondary | ICD-10-CM

## 2021-01-07 MED ORDER — CLARITHROMYCIN 500 MG PO TABS: 500.0000 mg | ORAL_TABLET | Freq: Two times a day (BID) | ORAL | 0 refills | Status: DC

## 2021-01-07 NOTE — Telephone Encounter (Signed)
[facility] requesting to change patients medication to rapid release Biaxin. Please advise. AS, CMA  Sherry with [facility] [phone]

## 2021-01-07 NOTE — Telephone Encounter (Signed)
Please let the patient know that I have changed biaxin to 500mg  IR twice daily for 10 days and sent [facility]. She needs to come back in for FBW only. It never did show up. She is aware that this was something she may need to do. She does not need new appointment, just the FBW. Thanks .

## 2021-01-07 NOTE — Telephone Encounter (Signed)
Called pt she is aware of her Rx change and her recommendation

## 2021-01-07 NOTE — Telephone Encounter (Signed)
Called pt no answer LVM to call the office 

## 2021-01-07 NOTE — Progress Notes (Signed)
Changed biaxin to 500mg  IR twice daily for 10 days and sent new prescription to [facility]. She needs to come back in for FBW only.

## 2021-01-08 DIAGNOSIS — E039 Hypothyroidism, unspecified: Secondary | ICD-10-CM | POA: Insufficient documentation

## 2021-01-08 DIAGNOSIS — E559 Vitamin D deficiency, unspecified: Secondary | ICD-10-CM | POA: Insufficient documentation

## 2021-01-09 ENCOUNTER — Other Ambulatory Visit: Payer: Self-pay | Admitting: Internal Medicine

## 2021-01-24 ENCOUNTER — Other Ambulatory Visit: Payer: Self-pay | Admitting: Nurse Practitioner

## 2021-01-24 DIAGNOSIS — G43909 Migraine, unspecified, not intractable, without status migrainosus: Secondary | ICD-10-CM

## 2021-01-24 DIAGNOSIS — R110 Nausea: Secondary | ICD-10-CM

## 2021-01-24 MED ORDER — IBUPROFEN 800 MG PO TABS: ORAL_TABLET | ORAL | 1 refills | Status: DC

## 2021-01-24 MED ORDER — PROMETHAZINE HCL 25 MG PO TABS: ORAL_TABLET | ORAL | 1 refills | Status: DC

## 2021-01-24 NOTE — Progress Notes (Signed)
Renewed prescriptions for both promethazine and ibuprofen and [facility] to [facility] per patient refill request.

## 2021-02-04 ENCOUNTER — Other Ambulatory Visit: Payer: Self-pay | Admitting: Nurse Practitioner

## 2021-02-04 DIAGNOSIS — G43909 Migraine, unspecified, not intractable, without status migrainosus: Secondary | ICD-10-CM

## 2021-02-12 ENCOUNTER — Other Ambulatory Visit: Payer: Self-pay | Admitting: Physician Assistant

## 2021-02-12 DIAGNOSIS — J301 Allergic rhinitis due to pollen: Secondary | ICD-10-CM

## 2021-03-07 ENCOUNTER — Other Ambulatory Visit: Payer: Self-pay | Admitting: Nurse Practitioner

## 2021-03-07 DIAGNOSIS — G44209 Tension-type headache, unspecified, not intractable: Secondary | ICD-10-CM

## 2021-03-07 DIAGNOSIS — G43909 Migraine, unspecified, not intractable, without status migrainosus: Secondary | ICD-10-CM

## 2021-03-07 DIAGNOSIS — R110 Nausea: Secondary | ICD-10-CM

## 2021-03-20 ENCOUNTER — Other Ambulatory Visit: Payer: Self-pay | Admitting: Physician Assistant

## 2021-03-20 DIAGNOSIS — J301 Allergic rhinitis due to pollen: Secondary | ICD-10-CM

## 2021-03-25 ENCOUNTER — Other Ambulatory Visit: Payer: Self-pay | Admitting: Nurse Practitioner

## 2021-03-25 DIAGNOSIS — J301 Allergic rhinitis due to pollen: Secondary | ICD-10-CM

## 2021-03-25 MED ORDER — MONTELUKAST SODIUM 10 MG PO TABS: 10.0000 mg | ORAL_TABLET | Freq: Every day | ORAL | 1 refills | Status: DC

## 2021-04-11 ENCOUNTER — Other Ambulatory Visit: Payer: Self-pay | Admitting: Internal Medicine

## 2021-04-11 DIAGNOSIS — E782 Mixed hyperlipidemia: Secondary | ICD-10-CM

## 2021-04-12 ENCOUNTER — Other Ambulatory Visit: Payer: Self-pay

## 2021-04-12 DIAGNOSIS — G43909 Migraine, unspecified, not intractable, without status migrainosus: Secondary | ICD-10-CM

## 2021-04-12 DIAGNOSIS — E782 Mixed hyperlipidemia: Secondary | ICD-10-CM

## 2021-04-12 DIAGNOSIS — J301 Allergic rhinitis due to pollen: Secondary | ICD-10-CM

## 2021-04-12 DIAGNOSIS — I10 Essential (primary) hypertension: Secondary | ICD-10-CM

## 2021-04-12 DIAGNOSIS — R110 Nausea: Secondary | ICD-10-CM

## 2021-04-12 MED ORDER — PROMETHAZINE HCL 25 MG PO TABS: ORAL_TABLET | ORAL | 1 refills | Status: DC

## 2021-04-12 MED ORDER — MONTELUKAST SODIUM 10 MG PO TABS: 10.0000 mg | ORAL_TABLET | Freq: Every day | ORAL | 1 refills | Status: DC

## 2021-04-12 MED ORDER — LISINOPRIL 40 MG PO TABS: 40.0000 mg | ORAL_TABLET | Freq: Every day | ORAL | 1 refills | Status: DC

## 2021-04-12 MED ORDER — ATORVASTATIN CALCIUM 10 MG PO TABS: 10.0000 mg | ORAL_TABLET | Freq: Every day | ORAL | 1 refills | Status: DC

## 2021-04-17 ENCOUNTER — Telehealth: Payer: Self-pay | Admitting: Nurse Practitioner

## 2021-04-17 ENCOUNTER — Other Ambulatory Visit: Payer: Self-pay

## 2021-04-17 DIAGNOSIS — G43909 Migraine, unspecified, not intractable, without status migrainosus: Secondary | ICD-10-CM

## 2021-04-17 MED ORDER — ZOLMITRIPTAN 5 MG PO TABS: ORAL_TABLET | ORAL | 2 refills | Status: DC

## 2021-04-17 NOTE — Telephone Encounter (Signed)
Patient requesting a refill of her Zomig. Please advise. [phone]

## 2021-04-17 NOTE — Telephone Encounter (Signed)
Rx was sent to pharmacy. 

## 2021-05-16 ENCOUNTER — Other Ambulatory Visit: Payer: Self-pay | Admitting: Nurse Practitioner

## 2021-05-16 ENCOUNTER — Other Ambulatory Visit: Payer: Self-pay

## 2021-05-16 DIAGNOSIS — G43909 Migraine, unspecified, not intractable, without status migrainosus: Secondary | ICD-10-CM

## 2021-05-16 DIAGNOSIS — G44209 Tension-type headache, unspecified, not intractable: Secondary | ICD-10-CM

## 2021-05-16 MED ORDER — TIZANIDINE HCL 4 MG PO TABS: ORAL_TABLET | ORAL | 2 refills | Status: DC

## 2021-05-16 MED ORDER — IBUPROFEN 800 MG PO TABS: ORAL_TABLET | ORAL | 1 refills | Status: DC

## 2021-05-16 MED ORDER — ZOLMITRIPTAN 5 MG PO TABS: ORAL_TABLET | ORAL | 2 refills | Status: DC

## 2021-06-19 ENCOUNTER — Other Ambulatory Visit: Payer: Self-pay | Admitting: Nurse Practitioner

## 2021-06-19 DIAGNOSIS — E782 Mixed hyperlipidemia: Secondary | ICD-10-CM

## 2021-06-25 ENCOUNTER — Ambulatory Visit: Payer: BC Managed Care – PPO | Admitting: Nurse Practitioner

## 2021-07-23 ENCOUNTER — Other Ambulatory Visit: Payer: Self-pay

## 2021-07-23 ENCOUNTER — Other Ambulatory Visit: Payer: Self-pay | Admitting: Nurse Practitioner

## 2021-07-23 DIAGNOSIS — G43909 Migraine, unspecified, not intractable, without status migrainosus: Secondary | ICD-10-CM

## 2021-07-23 MED ORDER — IBUPROFEN 800 MG PO TABS: ORAL_TABLET | ORAL | 0 refills | Status: DC

## 2021-08-07 ENCOUNTER — Other Ambulatory Visit: Payer: Self-pay | Admitting: Nurse Practitioner

## 2021-08-07 DIAGNOSIS — G44209 Tension-type headache, unspecified, not intractable: Secondary | ICD-10-CM

## 2021-08-07 DIAGNOSIS — I10 Essential (primary) hypertension: Secondary | ICD-10-CM

## 2021-09-23 ENCOUNTER — Other Ambulatory Visit: Payer: Self-pay

## 2021-09-23 DIAGNOSIS — G43909 Migraine, unspecified, not intractable, without status migrainosus: Secondary | ICD-10-CM

## 2021-09-23 MED ORDER — ZOLMITRIPTAN 5 MG PO TABS: ORAL_TABLET | ORAL | 2 refills | Status: DC

## 2021-10-06 NOTE — Progress Notes (Unsigned)
Established patient visit   Patient: Linda Rocha   DOB: [birth year]   46 y.o. Female  MRN: [id number] Visit Date: 10/07/2021   No chief complaint on file.  Subjective    HPI  Follow up visit -migraines -asthma -hypertension -hyperlipidemia.  -needs to have routine, fasting labs done.    Medications: Outpatient Medications Prior to Visit  Medication Sig   ALPRAZolam (XANAX) 1 MG tablet TK 1 T PO TID PRN   amphetamine-dextroamphetamine (ADDERALL) 15 MG tablet Take 15 mg by mouth 3 (three) times daily. Dr Evelene Croon in [place]   atorvastatin (LIPITOR) 10 MG tablet TAKE 1 TABLET BY MOUTH DAILY   buPROPion (WELLBUTRIN SR) 100 MG 12 hr tablet Take 100 mg by mouth daily.   buPROPion (WELLBUTRIN SR) 150 MG 12 hr tablet Take 150 mg by mouth 2 (two) times daily.   clarithromycin (BIAXIN) 500 MG tablet Take 1 tablet (500 mg total) by mouth 2 (two) times daily.   ergocalciferol (DRISDOL) 1.25 MG (50000 UT) capsule Take 1 capsule (50,000 Units total) by mouth once a week.   Galcanezumab-gnlm (EMGALITY) 120 MG/ML SOAJ Inject 120 mg into the skin every 30 (thirty) days.   ibuprofen (IBU) 800 MG tablet TAKE 1 TABLET BY MOUTH 3 TIMES DAILY AS NEEDED **PLEASE CONTACT OUR OFFICE TO SCHEDULE A FOLLOW UP FOR FUTURE MED REFILLS**   lisinopril (ZESTRIL) 40 MG tablet Take 1 tablet (40 mg total) by mouth daily. **PLEASE CONTACT OUR OFFICE TO SCHEDULE A FOLLOW UP FOR FUTURE MED REFILLS**   montelukast (SINGULAIR) 10 MG tablet Take 1 tablet (10 mg total) by mouth daily.   promethazine (PHENERGAN) 25 MG tablet TAKE ONE TABLET BY MOUTH TWICE DAILY AS NEEDED FOR NAUSEA   DUE TO MIGRAINE   tiZANidine (ZANAFLEX) 4 MG tablet TAKE 1/2 TO 1 TABLET BY MOUTH AS NEEDED FOR MUSCLE TENSION **PLEASE CONTACT OUR OFFICE TO SCHEDULE A FOLLOW UP FOR FUTURE MED REFILLS**   zolmitriptan (ZOMIG) 5 MG tablet TAKE ONE TABLET BY MOUTH AT ONSET OF MIGRAINE. MAY REPEAT DOSE IN 2 HOURS FORPERSISTANT MIGRAINES.   No  facility-administered medications prior to visit.    Review of Systems  {Labs (Optional):23779}   Objective    There were no vitals taken for this visit. BP Readings from Last 3 Encounters:  12/26/20 103/68  12/17/20 112/74  07/19/20 114/88    Wt Readings from Last 3 Encounters:  12/26/20 160 lb 1.6 oz (72.6 kg)  12/17/20 163 lb 1.6 oz (74 kg)  07/19/20 153 lb 6.4 oz (69.6 kg)    Physical Exam  ***  No results found for any visits on 10/07/21.  Assessment & Plan     Problem List Items Addressed This Visit   None    No follow-ups on file.         Carlean Jews, NP  [facility] at [place] [facility] [phone] (phone) [phone] (fax)  [facility]

## 2021-10-07 ENCOUNTER — Encounter: Payer: Self-pay | Admitting: Nurse Practitioner

## 2021-10-07 ENCOUNTER — Ambulatory Visit: Payer: BC Managed Care – PPO | Admitting: Nurse Practitioner

## 2021-10-07 VITALS — BP 114/74 | HR 70 | Temp 97.3°F | Ht 61.42 in | Wt 160.5 lb

## 2021-10-07 DIAGNOSIS — E559 Vitamin D deficiency, unspecified: Secondary | ICD-10-CM

## 2021-10-07 DIAGNOSIS — R7301 Impaired fasting glucose: Secondary | ICD-10-CM

## 2021-10-07 DIAGNOSIS — E039 Hypothyroidism, unspecified: Secondary | ICD-10-CM

## 2021-10-07 DIAGNOSIS — E782 Mixed hyperlipidemia: Secondary | ICD-10-CM

## 2021-10-07 DIAGNOSIS — G43909 Migraine, unspecified, not intractable, without status migrainosus: Secondary | ICD-10-CM

## 2021-10-07 DIAGNOSIS — J301 Allergic rhinitis due to pollen: Secondary | ICD-10-CM

## 2021-10-07 DIAGNOSIS — I10 Essential (primary) hypertension: Secondary | ICD-10-CM

## 2021-10-07 DIAGNOSIS — Z6829 Body mass index (BMI) 29.0-29.9, adult: Secondary | ICD-10-CM

## 2021-10-07 DIAGNOSIS — G44209 Tension-type headache, unspecified, not intractable: Secondary | ICD-10-CM

## 2021-10-07 DIAGNOSIS — Z0000 Encounter for general adult medical examination without abnormal findings: Secondary | ICD-10-CM

## 2021-10-07 MED ORDER — ZOLMITRIPTAN 5 MG PO TABS: ORAL_TABLET | ORAL | 5 refills | Status: DC

## 2021-10-07 MED ORDER — MONTELUKAST SODIUM 10 MG PO TABS
10.0000 mg | ORAL_TABLET | Freq: Every day | ORAL | 1 refills | Status: DC
Start: 2021-10-07 — End: 2022-03-05

## 2021-10-07 MED ORDER — ATORVASTATIN CALCIUM 10 MG PO TABS: 10.0000 mg | ORAL_TABLET | Freq: Every day | ORAL | 1 refills | Status: DC

## 2021-10-07 MED ORDER — LISINOPRIL 40 MG PO TABS
40.0000 mg | ORAL_TABLET | Freq: Every day | ORAL | 1 refills | Status: DC
Start: 2021-10-07 — End: 2022-03-03

## 2021-10-07 MED ORDER — AIMOVIG 140 MG/ML ~~LOC~~ SOAJ: 1.0000 mL | SUBCUTANEOUS | 5 refills | Status: DC

## 2021-10-07 MED ORDER — IBUPROFEN 800 MG PO TABS: ORAL_TABLET | ORAL | 3 refills | Status: DC

## 2021-10-07 MED ORDER — TIZANIDINE HCL 4 MG PO TABS
ORAL_TABLET | ORAL | 3 refills | Status: DC
Start: 2021-10-07 — End: 2022-02-13

## 2021-10-08 ENCOUNTER — Other Ambulatory Visit: Payer: Self-pay | Admitting: Nurse Practitioner

## 2021-10-08 DIAGNOSIS — E559 Vitamin D deficiency, unspecified: Secondary | ICD-10-CM

## 2021-10-08 LAB — COMPREHENSIVE METABOLIC PANEL
ALT: 11 IU/L (ref 0–32)
AST: 13 IU/L (ref 0–40)
Albumin/Globulin Ratio: 1.8 (ref 1.2–2.2)
Albumin: 4.4 g/dL (ref 3.8–4.8)
Alkaline Phosphatase: 72 IU/L (ref 44–121)
BUN/Creatinine Ratio: 11 (ref 9–23)
BUN: 12 mg/dL (ref 6–24)
Bilirubin Total: <0.2 mg/dL (ref 0.0–1.2)
CO2: 21 mmol/L (ref 20–29)
Calcium: 9.1 mg/dL (ref 8.7–10.2)
Chloride: 102 mmol/L (ref 96–106)
Creatinine, Ser: 1.08 mg/dL — ABNORMAL HIGH (ref 0.57–1.00)
Globulin, Total: 2.4 g/dL (ref 1.5–4.5)
Glucose: 81 mg/dL (ref 70–99)
Potassium: 3.5 mmol/L (ref 3.5–5.2)
Sodium: 137 mmol/L (ref 134–144)
Total Protein: 6.8 g/dL (ref 6.0–8.5)
eGFR: 64 mL/min/{1.73_m2} (ref >59)

## 2021-10-08 LAB — CBC WITH DIFFERENTIAL/PLATELET
Basophils Absolute: 0.1 10*3/uL (ref 0.0–0.2)
Basos: 1 %
EOS (ABSOLUTE): 0.2 10*3/uL (ref 0.0–0.4)
Eos: 4 %
Hematocrit: 37.7 % (ref 34.0–46.6)
Hemoglobin: 12.4 g/dL (ref 11.1–15.9)
Immature Grans (Abs): 0 10*3/uL (ref 0.0–0.1)
Immature Granulocytes: 0 %
Lymphocytes Absolute: 1.5 10*3/uL (ref 0.7–3.1)
Lymphs: 27 %
MCH: 29.4 pg (ref 26.6–33.0)
MCHC: 32.9 g/dL (ref 31.5–35.7)
MCV: 89 fL (ref 79–97)
Monocytes Absolute: 0.3 10*3/uL (ref 0.1–0.9)
Monocytes: 5 %
Neutrophils Absolute: 3.5 10*3/uL (ref 1.4–7.0)
Neutrophils: 63 %
Platelets: 359 10*3/uL (ref 150–450)
RBC: 4.22 x10E6/uL (ref 3.77–5.28)
RDW: 13.4 % (ref 11.7–15.4)
WBC: 5.6 10*3/uL (ref 3.4–10.8)

## 2021-10-08 LAB — LIPID PANEL
Chol/HDL Ratio: 3.1 ratio (ref 0.0–4.4)
Cholesterol, Total: 185 mg/dL (ref 100–199)
HDL: 60 mg/dL (ref >39)
LDL Chol Calc (NIH): 110 mg/dL — ABNORMAL HIGH (ref 0–99)
Triglycerides: 83 mg/dL (ref 0–149)
VLDL Cholesterol Cal: 15 mg/dL (ref 5–40)

## 2021-10-08 LAB — HEMOGLOBIN A1C
Est. average glucose Bld gHb Est-mCnc: 108 mg/dL
Hgb A1c MFr Bld: 5.4 % (ref 4.8–5.6)

## 2021-10-08 LAB — T4, FREE: Free T4: 0.88 ng/dL (ref 0.82–1.77)

## 2021-10-08 LAB — VITAMIN D 25 HYDROXY (VIT D DEFICIENCY, FRACTURES): Vit D, 25-Hydroxy: 18.2 ng/mL — ABNORMAL LOW (ref 30.0–100.0)

## 2021-10-08 LAB — TSH: TSH: 0.928 u[IU]/mL (ref 0.450–4.500)

## 2021-10-08 MED ORDER — ERGOCALCIFEROL 1.25 MG (50000 UT) PO CAPS: 50000.0000 [IU] | ORAL_CAPSULE | ORAL | 5 refills | Status: DC

## 2021-10-08 NOTE — Progress Notes (Signed)
Please let the patient know that her labs are back. Her vitamin d was moderately low. I have sent her in drisdol which is high dose vitamin d. This is taken once weekly for next few months. Her bad cholesterol was just a little elevated. Recommend patient limit intake of fried and fatty foods. She should increase intake of lean proteins and green leafy vegetables. Adding exercise into daily routine will also be beneficial.   All other labs are good.  Thanks so much.   -HB

## 2021-10-17 ENCOUNTER — Other Ambulatory Visit: Payer: Self-pay

## 2021-10-17 DIAGNOSIS — R110 Nausea: Secondary | ICD-10-CM

## 2021-10-17 DIAGNOSIS — G43909 Migraine, unspecified, not intractable, without status migrainosus: Secondary | ICD-10-CM

## 2021-10-17 MED ORDER — PROMETHAZINE HCL 25 MG PO TABS: ORAL_TABLET | ORAL | 1 refills | Status: DC

## 2021-11-11 ENCOUNTER — Telehealth: Payer: Self-pay | Admitting: Nurse Practitioner

## 2021-11-11 NOTE — Telephone Encounter (Signed)
Patient called and said new preventative migraine medication (Aimovig) was denied coverage by insurance. She states you were going to prescribe a different medication if this was to happen.   Patient is aware you are out of the office this week on PAL and that you will not return until 7/31. AS, CMA

## 2021-11-20 NOTE — Telephone Encounter (Signed)
Can you please address this? She has called twice since this message.

## 2021-11-23 ENCOUNTER — Other Ambulatory Visit: Payer: Self-pay | Admitting: Nurse Practitioner

## 2021-11-23 DIAGNOSIS — G43909 Migraine, unspecified, not intractable, without status migrainosus: Secondary | ICD-10-CM

## 2021-11-23 DIAGNOSIS — R110 Nausea: Secondary | ICD-10-CM

## 2021-12-02 NOTE — Telephone Encounter (Signed)
Can you contact her insurance and ask for a prior authorization form? That would be the best and fastes way to get this done. They are looking for a prior authorization.for migraine preventives.

## 2022-01-14 ENCOUNTER — Other Ambulatory Visit: Payer: Self-pay | Admitting: Nurse Practitioner

## 2022-01-14 DIAGNOSIS — G43909 Migraine, unspecified, not intractable, without status migrainosus: Secondary | ICD-10-CM

## 2022-02-12 ENCOUNTER — Other Ambulatory Visit: Payer: Self-pay | Admitting: Nurse Practitioner

## 2022-02-12 DIAGNOSIS — G43909 Migraine, unspecified, not intractable, without status migrainosus: Secondary | ICD-10-CM

## 2022-02-12 DIAGNOSIS — G44209 Tension-type headache, unspecified, not intractable: Secondary | ICD-10-CM

## 2022-02-12 MED ORDER — ZOLMITRIPTAN 5 MG PO TABS: ORAL_TABLET | ORAL | 5 refills | Status: DC

## 2022-03-01 ENCOUNTER — Other Ambulatory Visit: Payer: Self-pay | Admitting: Nurse Practitioner

## 2022-03-01 DIAGNOSIS — I10 Essential (primary) hypertension: Secondary | ICD-10-CM

## 2022-03-05 ENCOUNTER — Other Ambulatory Visit: Payer: Self-pay

## 2022-03-05 ENCOUNTER — Other Ambulatory Visit: Payer: Self-pay | Admitting: Nurse Practitioner

## 2022-03-05 DIAGNOSIS — R110 Nausea: Secondary | ICD-10-CM

## 2022-03-05 DIAGNOSIS — J301 Allergic rhinitis due to pollen: Secondary | ICD-10-CM

## 2022-03-05 DIAGNOSIS — G43909 Migraine, unspecified, not intractable, without status migrainosus: Secondary | ICD-10-CM

## 2022-03-05 MED ORDER — PROMETHAZINE HCL 25 MG PO TABS: ORAL_TABLET | ORAL | 0 refills | Status: DC

## 2022-03-24 ENCOUNTER — Other Ambulatory Visit: Payer: Self-pay | Admitting: Nurse Practitioner

## 2022-03-24 DIAGNOSIS — R110 Nausea: Secondary | ICD-10-CM

## 2022-03-24 DIAGNOSIS — G43909 Migraine, unspecified, not intractable, without status migrainosus: Secondary | ICD-10-CM

## 2022-03-31 ENCOUNTER — Other Ambulatory Visit: Payer: Self-pay

## 2022-03-31 ENCOUNTER — Telehealth: Payer: Self-pay | Admitting: *Deleted

## 2022-03-31 DIAGNOSIS — R110 Nausea: Secondary | ICD-10-CM

## 2022-03-31 DIAGNOSIS — G43909 Migraine, unspecified, not intractable, without status migrainosus: Secondary | ICD-10-CM

## 2022-03-31 MED ORDER — ZOLMITRIPTAN 5 MG PO TABS: ORAL_TABLET | ORAL | 5 refills | Status: DC

## 2022-03-31 MED ORDER — PROMETHAZINE HCL 25 MG PO TABS: ORAL_TABLET | ORAL | 0 refills | Status: DC

## 2022-03-31 NOTE — Telephone Encounter (Signed)
Pt called requesting refill on:     promethazine (PHENERGAN) 25 MG tablet   zolmitriptan (ZOMIG) 5 MG tablet   Pharmacy:  [facility] - [place], [place] - [address] [place], [place] [address] Phone: [phone]  Fax: [phone]   LOV:10/07/2021 ROV:04/08/2022

## 2022-03-31 NOTE — Telephone Encounter (Signed)
Rx has been sent to pharmacy

## 2022-04-07 NOTE — Progress Notes (Deleted)
Complete physical exam   Patient: Linda Rocha   DOB: [birth year]   46 y.o. Female  MRN: [id number] Visit Date: 04/08/2022    No chief complaint on file.  Subjective    Linda Rocha is a 46 y.o. female who presents today for a complete physical exam.  She reports consuming a {diet types:17450} diet. {Exercise:19826} She generally feels {well/fairly well/poorly:18703}. She {does/does not:200015} have additional problems to discuss today.   HPI  Annual physical -migraine headaches  -will try again to get preventive medication -?need for flu shot nd DTaP -had routine, fasting labs since last visit --mild elevation of LDL  --other labs essentially normal .  -hypertension  -history of asthma and allergies    Past Medical History:  Diagnosis Date   Allergic rhinitis    Asthma    Hyperlipidemia    Hypertension    Migraines    No past surgical history on file. Social History   Socioeconomic History   Marital status: Married    Spouse name: Not on file   Number of children: Not on file   Years of education: Not on file   Highest education level: Not on file  Occupational History   Not on file  Tobacco Use   Smoking status: Never   Smokeless tobacco: Never  Substance and Sexual Activity   Alcohol use: Never   Drug use: Never   Sexual activity: Yes  Other Topics Concern   Not on file  Social History Narrative   Not on file   Social Determinants of Health   Financial Resource Strain: Not on file  Food Insecurity: Not on file  Transportation Needs: Not on file  Physical Activity: Not on file  Stress: Not on file  Social Connections: Not on file  Intimate Partner Violence: Not on file   Family Status  Relation Name Status   Mother  (Not Specified)   Father  (Not Specified)   MGM  (Not Specified)   MGF  (Not Specified)   PGM  (Not Specified)   PGF  (Not Specified)   Family History  Problem Relation Age of Onset   Alcohol abuse Mother    Asthma  Mother    Coronary artery disease Mother    Hypertension Mother    Migraines Mother    Alcohol abuse Father    Asthma Father    Hypertension Father    Diabetes Maternal Grandmother    Diabetes Maternal Grandfather    Diabetes Paternal Grandmother    Diabetes Paternal Grandfather    Allergies  Allergen Reactions   Other Anaphylaxis   Sulfa Antibiotics Anaphylaxis    Patient Care Team: Carlean Jews, NP as PCP - General (Family Medicine)   Medications: Outpatient Medications Prior to Visit  Medication Sig   ALPRAZolam (XANAX) 1 MG tablet TK 1 T PO TID PRN   amphetamine-dextroamphetamine (ADDERALL) 15 MG tablet Take 15 mg by mouth 3 (three) times daily. Dr Evelene Croon in [place]   atorvastatin (LIPITOR) 10 MG tablet Take 1 tablet (10 mg total) by mouth daily.   buPROPion (WELLBUTRIN SR) 100 MG 12 hr tablet Take 100 mg by mouth daily.   buPROPion (WELLBUTRIN SR) 150 MG 12 hr tablet Take 150 mg by mouth 2 (two) times daily.   Erenumab-aooe (AIMOVIG) 140 MG/ML SOAJ Inject 140 mg into the skin every 30 (thirty) days.   ergocalciferol (DRISDOL) 1.25 MG (50000 UT) capsule Take 1 capsule (50,000 Units total) by mouth once a week.  ibuprofen (IBU) 800 MG tablet TAKE 1 TABLET BY MOUTH THREE TIMES DAILYAS NEEDED . NEED FOLLOW UP FOR FUTURE REFILLS   lisinopril (ZESTRIL) 40 MG tablet TAKE 1 TABLET BY MOUTH ONCE DAILY   montelukast (SINGULAIR) 10 MG tablet TAKE 1 TABLET BY MOUTH DAILY   promethazine (PHENERGAN) 25 MG tablet TAKE ONE (1) TABLET BY MOUTH TWO TIMES PER DAY AS NEEDED FOR NAUSEA DUE TO MIGRAINE   tiZANidine (ZANAFLEX) 4 MG tablet TAKE 1/2 TO 1 TABLET BY MOUTH AS NEEDED FOR MUSCLE TENSION   zolmitriptan (ZOMIG) 5 MG tablet TAKE 1 TABLET BY MOUTH AT ONSET OF MIGRAINE. MAY REPEAT DOSE IN 2 HOURS FORPERSISTANT MIGRAINES (AS DIRECTED)   No facility-administered medications prior to visit.    Review of Systems  {Labs (Optional):23779}   Objective    There were no vitals filed  for this visit. There is no height or weight on file to calculate BMI.  BP Readings from Last 3 Encounters:  10/07/21 114/74  12/26/20 103/68  12/17/20 112/74    Wt Readings from Last 3 Encounters:  10/07/21 160 lb 8 oz (72.8 kg)  12/26/20 160 lb 1.6 oz (72.6 kg)  12/17/20 163 lb 1.6 oz (74 kg)     Physical Exam  ***  Last depression screening scores   Row Labels 10/07/2021    9:02 AM 12/26/2020    4:06 PM 12/17/2020    4:14 PM  PHQ 2/9 Scores   Section Header. No data exists in this row.     PHQ - 2 Score   2 0 0  PHQ- 9 Score   7 0 0   Last fall risk screening   Row Labels 12/26/2020    4:06 PM  Fall Risk    Section Header. No data exists in this row.   Falls in the past year?   0  Number falls in past yr:   0  Injury with Fall?   0  Follow up   Falls evaluation completed   Last Audit-C alcohol use screening   Row Labels 07/19/2020    3:20 PM  Alcohol Use Disorder Test (AUDIT)   Section Header. No data exists in this row.   1. How often do you have a drink containing alcohol?   0  2. How many drinks containing alcohol do you have on a typical day when you are drinking?   0  3. How often do you have six or more drinks on one occasion?   0  AUDIT-C Score   0   A score of 3 or more in women, and 4 or more in men indicates increased risk for alcohol abuse, EXCEPT if all of the points are from question 1   No results found for any visits on 04/08/22.  Assessment & Plan    Routine Health Maintenance and Physical Exam  Exercise Activities and Dietary recommendations  Goals   None      There is no immunization history on file for this patient.  Health Maintenance  Topic Date Due   COVID-19 Vaccine (1) Never done   DTaP/Tdap/Td (1 - Tdap) Never done   INFLUENZA VACCINE  Never done   COLONOSCOPY (Pts 45-37yrs Insurance coverage will need to be confirmed)  10/08/2022 (Originally 01/19/2019)   HIV Screening  10/08/2022 (Originally 01/18/1989)   PAP SMEAR-Modifier   10/07/2024   HPV VACCINES  Aged Out   Hepatitis C Screening  Discontinued    Discussed health benefits of physical activity,  and encouraged her to engage in regular exercise appropriate for her age and condition.  Problem List Items Addressed This Visit   None    No follow-ups on file.        Carlean Jews, NP  [facility] at [facility] [phone] (phone) [phone] (fax)  [facility]

## 2022-04-08 ENCOUNTER — Encounter: Payer: BC Managed Care – PPO | Admitting: Nurse Practitioner

## 2022-04-09 ENCOUNTER — Telehealth: Payer: Self-pay | Admitting: *Deleted

## 2022-04-09 NOTE — Telephone Encounter (Signed)
LVM for pt to call office, I believe she is wanting to reschedule her appointment. Marland KitchenApril Zimmerman Rocha, CMA

## 2022-04-10 ENCOUNTER — Telehealth: Payer: Self-pay | Admitting: *Deleted

## 2022-04-10 ENCOUNTER — Other Ambulatory Visit: Payer: Self-pay

## 2022-04-10 NOTE — Telephone Encounter (Signed)
Spoke with pharmacist from [facility] who is able to have prescriptions transferred back to that location and states it will take a few hours.

## 2022-04-10 NOTE — Telephone Encounter (Signed)
LVM requesting a return call. Clarification is needed on request.

## 2022-04-10 NOTE — Telephone Encounter (Signed)
Pt calling to reschedule her physical and said that she needs a refill on medication below.  Apparently she had to have it transferred out to [place] pharmacy. I contacted the pharmacy and they said they need a new Rx sent because the process to have it transferred back in from [place] takes too long. Please send new Rx.    lisinopril (ZESTRIL) 40 MG tablet     [facility] - [place], [place] - [address]

## 2022-04-15 ENCOUNTER — Other Ambulatory Visit: Payer: Self-pay | Admitting: Nurse Practitioner

## 2022-04-15 DIAGNOSIS — G43909 Migraine, unspecified, not intractable, without status migrainosus: Secondary | ICD-10-CM

## 2022-04-15 DIAGNOSIS — R110 Nausea: Secondary | ICD-10-CM

## 2022-05-13 ENCOUNTER — Other Ambulatory Visit: Payer: Self-pay | Admitting: Nurse Practitioner

## 2022-05-13 DIAGNOSIS — E782 Mixed hyperlipidemia: Secondary | ICD-10-CM

## 2022-05-19 ENCOUNTER — Other Ambulatory Visit: Payer: Self-pay

## 2022-05-19 DIAGNOSIS — G43909 Migraine, unspecified, not intractable, without status migrainosus: Secondary | ICD-10-CM

## 2022-05-19 DIAGNOSIS — R110 Nausea: Secondary | ICD-10-CM

## 2022-05-19 MED ORDER — PROMETHAZINE HCL 25 MG PO TABS: ORAL_TABLET | ORAL | 1 refills | Status: DC

## 2022-06-09 ENCOUNTER — Other Ambulatory Visit: Payer: Self-pay

## 2022-06-09 DIAGNOSIS — J301 Allergic rhinitis due to pollen: Secondary | ICD-10-CM

## 2022-06-09 MED ORDER — MONTELUKAST SODIUM 10 MG PO TABS: 10.0000 mg | ORAL_TABLET | Freq: Every day | ORAL | 0 refills | Status: DC

## 2022-06-12 ENCOUNTER — Other Ambulatory Visit: Payer: Self-pay

## 2022-06-12 ENCOUNTER — Telehealth: Payer: Self-pay | Admitting: *Deleted

## 2022-06-12 DIAGNOSIS — G43909 Migraine, unspecified, not intractable, without status migrainosus: Secondary | ICD-10-CM

## 2022-06-12 MED ORDER — ZOLMITRIPTAN 5 MG PO TABS: ORAL_TABLET | ORAL | 5 refills | Status: DC

## 2022-06-12 NOTE — Telephone Encounter (Signed)
Pt calling requesting a refill on below. She said pharmacy told her that it was not going to be filled anymore.  She would like enough medication sent in to last until appt.    zolmitriptan (ZOMIG) 5 MG tablet   [facility] - [place], [place]    LOV:10/07/21 ROV:08/08/22

## 2022-06-12 NOTE — Telephone Encounter (Signed)
Rx was sent to [facility]

## 2022-06-16 ENCOUNTER — Other Ambulatory Visit: Payer: Self-pay | Admitting: Nurse Practitioner

## 2022-06-16 DIAGNOSIS — G44209 Tension-type headache, unspecified, not intractable: Secondary | ICD-10-CM

## 2022-06-16 MED ORDER — TIZANIDINE HCL 4 MG PO TABS: ORAL_TABLET | ORAL | 1 refills | Status: DC

## 2022-06-19 ENCOUNTER — Other Ambulatory Visit: Payer: Self-pay | Admitting: Nurse Practitioner

## 2022-06-19 DIAGNOSIS — G44209 Tension-type headache, unspecified, not intractable: Secondary | ICD-10-CM

## 2022-06-19 MED ORDER — TIZANIDINE HCL 4 MG PO TABS: ORAL_TABLET | ORAL | 1 refills | Status: DC

## 2022-07-08 ENCOUNTER — Other Ambulatory Visit: Payer: Self-pay | Admitting: Nurse Practitioner

## 2022-07-08 DIAGNOSIS — R110 Nausea: Secondary | ICD-10-CM

## 2022-07-08 DIAGNOSIS — G43909 Migraine, unspecified, not intractable, without status migrainosus: Secondary | ICD-10-CM

## 2022-08-08 ENCOUNTER — Encounter: Payer: Self-pay | Admitting: Nurse Practitioner

## 2022-08-08 ENCOUNTER — Ambulatory Visit (INDEPENDENT_AMBULATORY_CARE_PROVIDER_SITE_OTHER): Payer: Managed Care, Other (non HMO) | Admitting: Nurse Practitioner

## 2022-08-08 VITALS — BP 114/78 | HR 87 | Ht 61.42 in | Wt 159.0 lb

## 2022-08-08 DIAGNOSIS — J301 Allergic rhinitis due to pollen: Secondary | ICD-10-CM

## 2022-08-08 DIAGNOSIS — E039 Hypothyroidism, unspecified: Secondary | ICD-10-CM

## 2022-08-08 DIAGNOSIS — I10 Essential (primary) hypertension: Secondary | ICD-10-CM

## 2022-08-08 DIAGNOSIS — Z0001 Encounter for general adult medical examination with abnormal findings: Secondary | ICD-10-CM | POA: Diagnosis not present

## 2022-08-08 DIAGNOSIS — R110 Nausea: Secondary | ICD-10-CM

## 2022-08-08 DIAGNOSIS — G44209 Tension-type headache, unspecified, not intractable: Secondary | ICD-10-CM

## 2022-08-08 DIAGNOSIS — E782 Mixed hyperlipidemia: Secondary | ICD-10-CM

## 2022-08-08 DIAGNOSIS — R7301 Impaired fasting glucose: Secondary | ICD-10-CM

## 2022-08-08 DIAGNOSIS — G43909 Migraine, unspecified, not intractable, without status migrainosus: Secondary | ICD-10-CM

## 2022-08-08 DIAGNOSIS — E559 Vitamin D deficiency, unspecified: Secondary | ICD-10-CM

## 2022-08-08 MED ORDER — ATORVASTATIN CALCIUM 10 MG PO TABS: 10.0000 mg | ORAL_TABLET | Freq: Every day | ORAL | 1 refills | Status: DC

## 2022-08-08 MED ORDER — MONTELUKAST SODIUM 10 MG PO TABS
10.0000 mg | ORAL_TABLET | Freq: Every day | ORAL | 1 refills | Status: DC
Start: 2022-08-08 — End: 2023-03-02

## 2022-08-08 MED ORDER — ERGOCALCIFEROL 1.25 MG (50000 UT) PO CAPS
50000.0000 [IU] | ORAL_CAPSULE | ORAL | 5 refills | Status: DC
Start: 2022-08-08 — End: 2023-03-02

## 2022-08-08 MED ORDER — PROMETHAZINE HCL 25 MG PO TABS
ORAL_TABLET | ORAL | 1 refills | Status: DC
Start: 2022-08-08 — End: 2022-11-24

## 2022-08-08 MED ORDER — TIZANIDINE HCL 4 MG PO TABS
ORAL_TABLET | ORAL | 1 refills | Status: AC
Start: 2022-08-08 — End: ?

## 2022-08-08 MED ORDER — ALL-BODY MASSAGE MISC
11 refills | Status: AC
Start: 2022-08-08 — End: ?

## 2022-08-08 MED ORDER — AIMOVIG 140 MG/ML ~~LOC~~ SOAJ
1.0000 mL | SUBCUTANEOUS | 1 refills | Status: DC
Start: 2022-08-08 — End: 2023-03-02

## 2022-08-08 MED ORDER — ZOLMITRIPTAN 5 MG PO TABS
ORAL_TABLET | ORAL | 5 refills | Status: DC
Start: 2022-08-08 — End: 2022-10-29

## 2022-08-08 MED ORDER — LISINOPRIL 40 MG PO TABS
40.0000 mg | ORAL_TABLET | Freq: Every day | ORAL | 1 refills | Status: DC
Start: 2022-08-08 — End: 2022-12-16

## 2022-08-08 NOTE — Progress Notes (Signed)
Complete physical exam   Patient: Linda Rocha   DOB: 1976-04-10   47 y.o. Female  MRN: [id number] Visit Date: 08/08/2022    Chief Complaint  Patient presents with   Employment Physical   Subjective    Linda Rocha is a 47 y.o. female who presents today for a complete physical exam.  She reports consuming a  1500  calorie  diet.  She generally feels well. She does not have additional problems to discuss today.   HPI  Annual physical  -hypertension --generally well controlled.  -migraine headaches  --continues to  get more than 15 headache days per month.  --have had trouble getting preventive medication  approved by insurance in the past.  -chronic allergic rhinitis.  -She denies chest pain, chest pressure, or shortness of breath. She denies headaches or visual disturbances. She denies abdominal pain, nausea, vomiting, or changes in bowel or bladder habits.    Past Medical History:  Diagnosis Date   Allergic rhinitis    Asthma    Hyperlipidemia    Hypertension    Migraines    History reviewed. No pertinent surgical history. Social History   Socioeconomic History   Marital status: Married    Spouse name: Not on file   Number of children: Not on file   Years of education: Not on file   Highest education level: Not on file  Occupational History   Not on file  Tobacco Use   Smoking status: Never   Smokeless tobacco: Never  Substance and Sexual Activity   Alcohol use: Never   Drug use: Never   Sexual activity: Yes  Other Topics Concern   Not on file  Social History Narrative   Not on file   Social Determinants of Health   Financial Resource Strain: Not on file  Food Insecurity: Not on file  Transportation Needs: Not on file  Physical Activity: Not on file  Stress: Not on file  Social Connections: Not on file  Intimate Partner Violence: Not on file   Family Status  Relation Name Status   Mother  (Not Specified)   Father  (Not Specified)   MGM   (Not Specified)   MGF  (Not Specified)   PGM  (Not Specified)   PGF  (Not Specified)   Family History  Problem Relation Age of Onset   Alcohol abuse Mother    Asthma Mother    Coronary artery disease Mother    Hypertension Mother    Migraines Mother    Alcohol abuse Father    Asthma Father    Hypertension Father    Diabetes Maternal Grandmother    Diabetes Maternal Grandfather    Diabetes Paternal Grandmother    Diabetes Paternal Grandfather    Allergies  Allergen Reactions   Other Anaphylaxis   Sulfa Antibiotics Anaphylaxis    Patient Care Team: Carlean Jews, NP as PCP - General (Family Medicine)   Medications: Outpatient Medications Prior to Visit  Medication Sig   ALPRAZolam (XANAX) 1 MG tablet TK 1 T PO TID PRN   amphetamine-dextroamphetamine (ADDERALL) 15 MG tablet Take 15 mg by mouth 3 (three) times daily. Dr Evelene Croon in [place]   buPROPion [facility] SR) 150 MG 12 hr tablet Take 150 mg by mouth 2 (two) times daily.   ibuprofen (IBU) 800 MG tablet TAKE 1 TABLET BY MOUTH THREE TIMES DAILYAS NEEDED . NEED FOLLOW UP FOR FUTURE REFILLS   [DISCONTINUED] atorvastatin (LIPITOR) 10 MG tablet TAKE 1 TABLET BY  MOUTH ONCE DAILY   [DISCONTINUED] Erenumab-aooe (AIMOVIG) 140 MG/ML SOAJ Inject 140 mg into the skin every 30 (thirty) days.   [DISCONTINUED] ergocalciferol (DRISDOL) 1.25 MG (50000 UT) capsule Take 1 capsule (50,000 Units total) by mouth once a week.   [DISCONTINUED] lisinopril (ZESTRIL) 40 MG tablet TAKE 1 TABLET BY MOUTH ONCE DAILY   [DISCONTINUED] montelukast (SINGULAIR) 10 MG tablet Take 1 tablet (10 mg total) by mouth daily.   [DISCONTINUED] promethazine (PHENERGAN) 25 MG tablet TAKE 1 TABLET BY MOUTH TWICE DAILY AS NEEDED FOR NAUSEA DUE TO MIGRAINE   [DISCONTINUED] tiZANidine (ZANAFLEX) 4 MG tablet Take 1/2 to 1 tablet daily as needed for muscle pain/spasms   [DISCONTINUED] zolmitriptan (ZOMIG) 5 MG tablet TAKE 1 TABLET BY MOUTH AT ONSET OF MIGRAINE. MAY REPEAT  DOSE IN 2 HOURS FORPERSISTANT MIGRAINES (AS DIRECTED)   [DISCONTINUED] buPROPion (WELLBUTRIN SR) 100 MG 12 hr tablet Take 100 mg by mouth daily.   No facility-administered medications prior to visit.    Review of Systems See HPI    Last CBC Lab Results  Component Value Date   WBC 5.1 08/08/2022   HGB 12.3 08/08/2022   HCT 38.0 08/08/2022   MCV 93 08/08/2022   MCH 30.0 08/08/2022   RDW 12.5 08/08/2022   PLT 312 08/08/2022   Last metabolic panel Lab Results  Component Value Date   GLUCOSE 88 08/08/2022   NA 139 08/08/2022   K 4.3 08/08/2022   CL 107 (H) 08/08/2022   CO2 19 (L) 08/08/2022   BUN 16 08/08/2022   CREATININE 0.77 08/08/2022   EGFR 95 08/08/2022   CALCIUM 8.6 (L) 08/08/2022   PROT 6.6 08/08/2022   ALBUMIN 4.2 08/08/2022   LABGLOB 2.4 08/08/2022   AGRATIO 1.8 08/08/2022   BILITOT <0.2 08/08/2022   ALKPHOS 73 08/08/2022   AST 10 08/08/2022   ALT 7 08/08/2022   ANIONGAP 9 09/24/2011   Last lipids Lab Results  Component Value Date   CHOL 153 08/08/2022   HDL 64 08/08/2022   LDLCALC 76 08/08/2022   TRIG 68 08/08/2022   CHOLHDL 2.4 08/08/2022   Last hemoglobin A1c Lab Results  Component Value Date   HGBA1C 5.8 (H) 08/08/2022   Last thyroid functions Lab Results  Component Value Date   TSH 0.848 08/08/2022   Last vitamin D Lab Results  Component Value Date   VD25OH 9.8 (L) 08/08/2022        Objective     Today's Vitals   08/08/22 0951  BP: 114/78  Pulse: 87  SpO2: 99%  Weight: 159 lb (72.1 kg)  Height: 5' 1.42" (1.56 m)   Body mass index is 29.63 kg/m.  BP Readings from Last 3 Encounters:  08/08/22 114/78  10/07/21 114/74  12/26/20 103/68    Wt Readings from Last 3 Encounters:  08/08/22 159 lb (72.1 kg)  10/07/21 160 lb 8 oz (72.8 kg)  12/26/20 160 lb 1.6 oz (72.6 kg)     Physical Exam Vitals and nursing note reviewed.  Constitutional:      Appearance: Normal appearance. She is well-developed.  HENT:     Head:  Normocephalic and atraumatic.     Right Ear: Tympanic membrane, ear canal and external ear normal.     Left Ear: Tympanic membrane, ear canal and external ear normal.     Nose: Nose normal.     Mouth/Throat:     Mouth: Mucous membranes are moist.     Pharynx: Oropharynx is clear.  Eyes:  Extraocular Movements: Extraocular movements intact.     Conjunctiva/sclera: Conjunctivae normal.     Pupils: Pupils are equal, round, and reactive to light.  Neck:     Vascular: No carotid bruit.  Cardiovascular:     Rate and Rhythm: Normal rate and regular rhythm.     Pulses: Normal pulses.     Heart sounds: Normal heart sounds.  Pulmonary:     Effort: Pulmonary effort is normal.     Breath sounds: Normal breath sounds.  Abdominal:     General: Bowel sounds are normal. There is no distension.     Palpations: Abdomen is soft. There is no mass.     Tenderness: There is no abdominal tenderness. There is no right CVA tenderness, left CVA tenderness, guarding or rebound.     Hernia: No hernia is present.  Musculoskeletal:        General: Normal range of motion.     Cervical back: Normal range of motion and neck supple.  Lymphadenopathy:     Cervical: No cervical adenopathy.  Skin:    General: Skin is warm and dry.     Capillary Refill: Capillary refill takes less than 2 seconds.  Neurological:     General: No focal deficit present.     Mental Status: She is alert and oriented to person, place, and time.  Psychiatric:        Mood and Affect: Mood normal.        Behavior: Behavior normal.        Thought Content: Thought content normal.        Judgment: Judgment normal.     Last depression screening scores   Row Labels 08/08/2022    9:54 AM 10/07/2021    9:02 AM 12/26/2020    4:06 PM  PHQ 2/9 Scores   Section Header. No data exists in this row.     PHQ - 2 Score   0 2 0  PHQ- 9 Score   0 7 0   Last fall risk screening   Row Labels 08/08/2022    9:54 AM  Fall Risk    Section Header.  No data exists in this row.   Falls in the past year?   0  Number falls in past yr:   0  Injury with Fall?   0  Risk for fall due to :   No Fall Risks  Follow up   Falls evaluation completed   Last Audit-C alcohol use screening   Row Labels 07/19/2020    3:20 PM  Alcohol Use Disorder Test (AUDIT)   Section Header. No data exists in this row.   1. How often do you have a drink containing alcohol?   0  2. How many drinks containing alcohol do you have on a typical day when you are drinking?   0  3. How often do you have six or more drinks on one occasion?   0  AUDIT-C Score   0   A score of 3 or more in women, and 4 or more in men indicates increased risk for alcohol abuse, EXCEPT if all of the points are from question 1   Results for orders placed or performed in visit on 08/08/22  CBC  Result Value Ref Range   WBC 5.1 3.4 - 10.8 x10E3/uL   RBC 4.10 3.77 - 5.28 x10E6/uL   Hemoglobin 12.3 11.1 - 15.9 g/dL   Hematocrit 40.9 81.1 - 46.6 %   MCV 93 79 -  97 fL   MCH 30.0 26.6 - 33.0 pg   MCHC 32.4 31.5 - 35.7 g/dL   RDW 96.0 45.4 - 09.8 %   Platelets 312 150 - 450 x10E3/uL  Comprehensive metabolic panel  Result Value Ref Range   Glucose 88 70 - 99 mg/dL   BUN 16 6 - 24 mg/dL   Creatinine, Ser 1.19 0.57 - 1.00 mg/dL   eGFR 95 >14 NW/GNF/6.21   BUN/Creatinine Ratio 21 9 - 23   Sodium 139 134 - 144 mmol/L   Potassium 4.3 3.5 - 5.2 mmol/L   Chloride 107 (H) 96 - 106 mmol/L   CO2 19 (L) 20 - 29 mmol/L   Calcium 8.6 (L) 8.7 - 10.2 mg/dL   Total Protein 6.6 6.0 - 8.5 g/dL   Albumin 4.2 3.9 - 4.9 g/dL   Globulin, Total 2.4 1.5 - 4.5 g/dL   Albumin/Globulin Ratio 1.8 1.2 - 2.2   Bilirubin Total <0.2 0.0 - 1.2 mg/dL   Alkaline Phosphatase 73 44 - 121 IU/L   AST 10 0 - 40 IU/L   ALT 7 0 - 32 IU/L  Lipid panel  Result Value Ref Range   Cholesterol, Total 153 100 - 199 mg/dL   Triglycerides 68 0 - 149 mg/dL   HDL 64 >30 mg/dL   VLDL Cholesterol Cal 13 5 - 40 mg/dL   LDL Chol Calc  (NIH) 76 0 - 99 mg/dL   Chol/HDL Ratio 2.4 0.0 - 4.4 ratio  Hemoglobin A1c  Result Value Ref Range   Hgb A1c MFr Bld 5.8 (H) 4.8 - 5.6 %   Est. average glucose Bld gHb Est-mCnc 120 mg/dL  VITAMIN D 25 Hydroxy (Vit-D Deficiency, Fractures)  Result Value Ref Range   Vit D, 25-Hydroxy 9.8 (L) 30.0 - 100.0 ng/mL  TSH + free T4  Result Value Ref Range   TSH 0.848 0.450 - 4.500 uIU/mL   Free T4 0.88 0.82 - 1.77 ng/dL    Assessment & Plan    Encounter for general adult medical examination with abnormal findings Assessment & Plan: Annual physical complete.   Essential hypertension Assessment & Plan: Blood pressure stable. -Continue current medication.   Orders: -     Lisinopril; Take 1 tablet (40 mg total) by mouth daily.  Dispense: 90 tablet; Refill: 1 -     Comprehensive metabolic panel; Future -     CBC; Future  Mixed hyperlipidemia Assessment & Plan: Check fasting lipid panel today  -adjust dosing cholesterol lowering medication as indicated   Orders: -     Atorvastatin Calcium; Take 1 tablet (10 mg total) by mouth daily.  Dispense: 90 tablet; Refill: 1 -     Lipid panel; Future -     Comprehensive metabolic panel; Future -     CBC; Future  Migraine without status migrainosus, not intractable, unspecified migraine type Assessment & Plan: Trial Aimovig monthly. -Continue abortive therapy as needed as prescribed. -Adjust frequency of abortive therapy as indicated.  Orders: -     Promethazine HCl; TAKE 1 TABLET BY MOUTH TWICE DAILY AS NEEDED FOR NAUSEA DUE TO MIGRAINE  Dispense: 90 tablet; Refill: 1 -     ZOLMitriptan; TAKE 1 TABLET BY MOUTH AT ONSET OF MIGRAINE. MAY REPEAT DOSE IN 2 HOURS FORPERSISTANT MIGRAINES (AS DIRECTED)  Dispense: 16 tablet; Refill: 5 -     Aimovig; Inject 140 mg into the skin every 30 (thirty) days.  Dispense: 3 mL; Refill: 1  Nausea Assessment & Plan: May take  promethazine as needed and as prescribed during migraine headaches.  Orders: -      Promethazine HCl; TAKE 1 TABLET BY MOUTH TWICE DAILY AS NEEDED FOR NAUSEA DUE TO MIGRAINE  Dispense: 90 tablet; Refill: 1  Vitamin D deficiency Assessment & Plan: added Drisdol 16109 iu weekly, which is high dose vitamin d. She will take this weekly for next few months.   Orders: -     Ergocalciferol; Take 1 capsule (50,000 Units total) by mouth once a week.  Dispense: 4 capsule; Refill: 5 -     VITAMIN D 25 Hydroxy (Vit-D Deficiency, Fractures); Future  Seasonal allergic rhinitis due to pollen Assessment & Plan: Continue all medications for allergies as prescribed. -Refills provided for all today.  Orders: -     Montelukast Sodium; Take 1 tablet (10 mg total) by mouth daily.  Dispense: 90 tablet; Refill: 1  Muscle tension headache Assessment & Plan: May continue to take tizanidine as needed and as prescribed for muscle tension and headaches. Written prescription given for intermittent massages as needed.  Orders: -     tiZANidine HCl; Take 1/2 to 1 tablet daily as needed for muscle pain/spasms  Dispense: 90 tablet; Refill: 1 -     All-Body Massage; All-body massage twice monthly prn  Dispense: 2 each; Refill: 11  Hypothyroidism, unspecified type Assessment & Plan: Check thyroid panel. -treat hypothyroid as indicated   Orders: -     TSH + free T4; Future  Impaired fasting glucose Assessment & Plan: Check hemoglobin A1c with fasting labs.  Orders: -     Hemoglobin A1c; Future       Health Maintenance  Topic Date Due   COVID-19 Vaccine (1) Never done   DTaP/Tdap/Td (1 - Tdap) Never done   PAP SMEAR-Modifier  12/07/2021   COLONOSCOPY (Pts 45-47yrs Insurance coverage will need to be confirmed)  10/08/2022 (Originally 01/19/2019)   HIV Screening  10/08/2022 (Originally 01/18/1989)   INFLUENZA VACCINE  11/20/2022   HPV VACCINES  Aged Out   Hepatitis C Screening  Discontinued    Discussed health benefits of physical activity, and encouraged her to engage in regular  exercise appropriate for her age and condition.   Return in about 6 months (around 02/07/2023) for blood pressure, med refills,migraine.        Carlean Jews, NP  [facility] at [facility] [phone] (phone) [phone] (fax)  [facility]

## 2022-08-09 LAB — HEMOGLOBIN A1C
Est. average glucose Bld gHb Est-mCnc: 120 mg/dL
Hgb A1c MFr Bld: 5.8 % — ABNORMAL HIGH (ref 4.8–5.6)

## 2022-08-09 LAB — COMPREHENSIVE METABOLIC PANEL
ALT: 7 IU/L (ref 0–32)
AST: 10 IU/L (ref 0–40)
Albumin/Globulin Ratio: 1.8 (ref 1.2–2.2)
Albumin: 4.2 g/dL (ref 3.9–4.9)
Alkaline Phosphatase: 73 IU/L (ref 44–121)
BUN/Creatinine Ratio: 21 (ref 9–23)
BUN: 16 mg/dL (ref 6–24)
Bilirubin Total: <0.2 mg/dL (ref 0.0–1.2)
CO2: 19 mmol/L — ABNORMAL LOW (ref 20–29)
Calcium: 8.6 mg/dL — ABNORMAL LOW (ref 8.7–10.2)
Chloride: 107 mmol/L — ABNORMAL HIGH (ref 96–106)
Creatinine, Ser: 0.77 mg/dL (ref 0.57–1.00)
Globulin, Total: 2.4 g/dL (ref 1.5–4.5)
Glucose: 88 mg/dL (ref 70–99)
Potassium: 4.3 mmol/L (ref 3.5–5.2)
Sodium: 139 mmol/L (ref 134–144)
Total Protein: 6.6 g/dL (ref 6.0–8.5)
eGFR: 95 mL/min/{1.73_m2} (ref >59)

## 2022-08-09 LAB — CBC
Hematocrit: 38 % (ref 34.0–46.6)
Hemoglobin: 12.3 g/dL (ref 11.1–15.9)
MCH: 30 pg (ref 26.6–33.0)
MCHC: 32.4 g/dL (ref 31.5–35.7)
MCV: 93 fL (ref 79–97)
Platelets: 312 10*3/uL (ref 150–450)
RBC: 4.1 x10E6/uL (ref 3.77–5.28)
RDW: 12.5 % (ref 11.7–15.4)
WBC: 5.1 10*3/uL (ref 3.4–10.8)

## 2022-08-09 LAB — TSH+FREE T4
Free T4: 0.88 ng/dL (ref 0.82–1.77)
TSH: 0.848 u[IU]/mL (ref 0.450–4.500)

## 2022-08-09 LAB — LIPID PANEL
Chol/HDL Ratio: 2.4 ratio (ref 0.0–4.4)
Cholesterol, Total: 153 mg/dL (ref 100–199)
HDL: 64 mg/dL (ref >39)
LDL Chol Calc (NIH): 76 mg/dL (ref 0–99)
Triglycerides: 68 mg/dL (ref 0–149)
VLDL Cholesterol Cal: 13 mg/dL (ref 5–40)

## 2022-08-09 LAB — VITAMIN D 25 HYDROXY (VIT D DEFICIENCY, FRACTURES): Vit D, 25-Hydroxy: 9.8 ng/mL — ABNORMAL LOW (ref 30.0–100.0)

## 2022-08-11 ENCOUNTER — Encounter: Payer: Self-pay | Admitting: Nurse Practitioner

## 2022-09-07 NOTE — Assessment & Plan Note (Signed)
Trial Aimovig monthly. -Continue abortive therapy as needed as prescribed. -Adjust frequency of abortive therapy as indicated.

## 2022-09-07 NOTE — Assessment & Plan Note (Signed)
Check hemoglobin A1c with fasting labs.

## 2022-09-07 NOTE — Assessment & Plan Note (Signed)
Annual physical complete.

## 2022-09-07 NOTE — Assessment & Plan Note (Signed)
Check fasting lipid panel today  -adjust dosing cholesterol lowering medication as indicated

## 2022-09-07 NOTE — Assessment & Plan Note (Signed)
added Drisdol 50000 iu weekly, which is high dose vitamin d. She will take this weekly for next few months.  

## 2022-09-07 NOTE — Assessment & Plan Note (Signed)
May continue to take tizanidine as needed and as prescribed for muscle tension and headaches. Written prescription given for intermittent massages as needed.

## 2022-09-07 NOTE — Assessment & Plan Note (Signed)
May take promethazine as needed and as prescribed during migraine headaches.

## 2022-09-07 NOTE — Assessment & Plan Note (Signed)
Check thyroid panel. -treat hypothyroid as indicated

## 2022-09-07 NOTE — Assessment & Plan Note (Signed)
Blood pressure stable. Continue current medication.  

## 2022-09-07 NOTE — Assessment & Plan Note (Signed)
Continue all medications for allergies as prescribed. -Refills provided for all today.

## 2022-10-29 ENCOUNTER — Other Ambulatory Visit: Payer: Self-pay | Admitting: Nurse Practitioner

## 2022-10-29 DIAGNOSIS — G43909 Migraine, unspecified, not intractable, without status migrainosus: Secondary | ICD-10-CM

## 2022-11-17 ENCOUNTER — Telehealth: Payer: Self-pay | Admitting: *Deleted

## 2022-11-17 DIAGNOSIS — G43909 Migraine, unspecified, not intractable, without status migrainosus: Secondary | ICD-10-CM

## 2022-11-17 NOTE — Telephone Encounter (Signed)
Pharmacy calling with concerns for patients Rx of zolmitriptan (ZOMIG) 5 MG tablet.  [facility] pharmacy tech says that they filled 16 tablets on 10/29/22 and then another 16 on 11/10/22.  She is requesting another refill stating that she is going out of town.  The pharmacist was just concerned with the frequency.  Please advise. Told pharmacy I would call them at [phone].

## 2022-11-17 NOTE — Telephone Encounter (Signed)
Contacted pharmacy and informed them of below.    Sandre Kitty, MD  You2 hours ago (12:57 PM)    I talked to the patient on the phone.  The last dose she took was yesterday.  States she is out of medication and has a headache today.  Talked to her about not taking any more than the recommended max dose.  She would be willing to try an alternative after she gets back in town.  It's okay to fill her prescription today.

## 2022-11-24 ENCOUNTER — Other Ambulatory Visit: Payer: Self-pay | Admitting: Nurse Practitioner

## 2022-11-24 DIAGNOSIS — G43909 Migraine, unspecified, not intractable, without status migrainosus: Secondary | ICD-10-CM

## 2022-11-24 DIAGNOSIS — R110 Nausea: Secondary | ICD-10-CM

## 2022-12-16 ENCOUNTER — Other Ambulatory Visit: Payer: Self-pay

## 2022-12-16 DIAGNOSIS — I10 Essential (primary) hypertension: Secondary | ICD-10-CM

## 2022-12-16 MED ORDER — LISINOPRIL 40 MG PO TABS: 40.0000 mg | ORAL_TABLET | Freq: Every day | ORAL | 0 refills | Status: DC

## 2022-12-30 NOTE — Telephone Encounter (Signed)
At upcoming appointment on 02/09/2023, will discuss the need for migraine prophylaxis if she is needing to fill her zolmitriptan prescription as often as she has been.

## 2022-12-30 NOTE — Telephone Encounter (Signed)
[facility] calling concerning the patients early fill on the zolmitriptan RX.  She is inquiring if they are to continue filling this medication early or what they need to do.  Spoke with provider and she said that the patient has an appointment on 02/09/23 and they would discuss this medication at that time.  Contacted the pharmacy and told them they could continue to fill this however there are no refills left for this medication and they were going to make a note that the patient should contact provider for any refills. The number should you need to contact the pharmacy is [phone]

## 2022-12-31 MED ORDER — ZOLMITRIPTAN 5 MG PO TABS
ORAL_TABLET | ORAL | 0 refills | Status: DC
Start: 2022-12-31 — End: 2023-02-09

## 2022-12-31 NOTE — Telephone Encounter (Signed)
Sent in 1 refill of #16 tablets which should last until her upcoming appointment on 02/09/2023.  This medication should not be used to daily. Limit use to <10 days per month to avoid medication-overuse headache .

## 2022-12-31 NOTE — Addendum Note (Signed)
Addended by: Saralyn Pilar on: 12/31/2022 04:43 PM   Modules accepted: Orders

## 2022-12-31 NOTE — Telephone Encounter (Signed)
Pharmacy called very concerned with the pattern of refills for this patient.  Refill dates are as follows: 5/4. 5/20. 6/5. 6/17. 6/27. 7/10. 7/22. 7/29. 8/19. 8/27. 9/7  Each refill 16 tablets at a time. Pharmacy is requesting holding refills that way you as the provider would have to sign off on refills and you would be aware or to refer pt to headache clinic. Pt is currently out of refills as of 9/7 she used the last one.

## 2023-01-01 ENCOUNTER — Other Ambulatory Visit: Payer: Self-pay | Admitting: Family Medicine

## 2023-01-14 ENCOUNTER — Other Ambulatory Visit: Payer: Self-pay | Admitting: Family Medicine

## 2023-01-14 DIAGNOSIS — E782 Mixed hyperlipidemia: Secondary | ICD-10-CM

## 2023-01-14 MED ORDER — ATORVASTATIN CALCIUM 10 MG PO TABS
10.0000 mg | ORAL_TABLET | Freq: Every day | ORAL | 1 refills | Status: DC
Start: 2023-01-14 — End: 2023-03-02

## 2023-01-19 ENCOUNTER — Other Ambulatory Visit: Payer: Self-pay

## 2023-01-19 DIAGNOSIS — G44209 Tension-type headache, unspecified, not intractable: Secondary | ICD-10-CM

## 2023-01-19 NOTE — Telephone Encounter (Signed)
Fax from [facility] is requesting refill on Tizanidine. Last refill was on 08/08/22 for 90 day and 1 refill. Refill will be discussed at next OV on 02/09/23.

## 2023-01-21 ENCOUNTER — Other Ambulatory Visit: Payer: Self-pay | Admitting: Nurse Practitioner

## 2023-01-21 ENCOUNTER — Other Ambulatory Visit: Payer: Self-pay | Admitting: Family Medicine

## 2023-01-21 DIAGNOSIS — G44209 Tension-type headache, unspecified, not intractable: Secondary | ICD-10-CM

## 2023-01-21 MED ORDER — TIZANIDINE HCL 4 MG PO TABS
ORAL_TABLET | ORAL | 0 refills | Status: DC
Start: 2023-01-21 — End: 2023-03-10

## 2023-01-26 ENCOUNTER — Other Ambulatory Visit: Payer: Self-pay | Admitting: Family Medicine

## 2023-01-26 DIAGNOSIS — G43909 Migraine, unspecified, not intractable, without status migrainosus: Secondary | ICD-10-CM

## 2023-02-06 ENCOUNTER — Other Ambulatory Visit: Payer: Self-pay | Admitting: Family Medicine

## 2023-02-06 DIAGNOSIS — G43909 Migraine, unspecified, not intractable, without status migrainosus: Secondary | ICD-10-CM

## 2023-02-09 ENCOUNTER — Ambulatory Visit: Payer: Managed Care, Other (non HMO) | Admitting: Nurse Practitioner

## 2023-02-09 ENCOUNTER — Ambulatory Visit: Payer: Managed Care, Other (non HMO) | Admitting: Family Medicine

## 2023-02-20 ENCOUNTER — Other Ambulatory Visit: Payer: Self-pay | Admitting: Family Medicine

## 2023-02-20 DIAGNOSIS — G43909 Migraine, unspecified, not intractable, without status migrainosus: Secondary | ICD-10-CM

## 2023-02-23 ENCOUNTER — Other Ambulatory Visit: Payer: Self-pay | Admitting: Family Medicine

## 2023-02-23 ENCOUNTER — Telehealth: Payer: Self-pay | Admitting: *Deleted

## 2023-02-23 DIAGNOSIS — G43909 Migraine, unspecified, not intractable, without status migrainosus: Secondary | ICD-10-CM

## 2023-02-23 MED ORDER — ZOLMITRIPTAN 5 MG PO TABS
ORAL_TABLET | ORAL | 0 refills | Status: DC
Start: 2023-02-23 — End: 2023-02-27

## 2023-02-23 NOTE — Telephone Encounter (Signed)
Pt calling to check on refill status of migraine medication and informed her that it was refused due to needing an appointment.  Told her she missed her appointment on 10/21 and that she would need to reschedule and she said she has an appointment scheduled on 11/11.  She would like a refill to last until her appointment and she would like it to go to [facility] pharmacy. Please advise.

## 2023-02-23 NOTE — Telephone Encounter (Signed)
Meds ordered this encounter  Medications   zolmitriptan (ZOMIG) 5 MG tablet    Sig: TAKE 1 TABLET BY MOUTH AT THE ONSET OF MIGRAINE. MAY REPEAT IN 2 HOURS. NO REFILLS UNTIL APPOINTMENT IN OCTOBER. LIMIT USE TO < 10 DAYS PER MONTH    Dispense:  6 tablet    Refill:  0    NEED REFILL    Order Specific Question:   Supervising Provider    Answer:   Sandre Kitty [[id number]]

## 2023-02-27 ENCOUNTER — Other Ambulatory Visit: Payer: Self-pay | Admitting: Family Medicine

## 2023-02-27 DIAGNOSIS — G43909 Migraine, unspecified, not intractable, without status migrainosus: Secondary | ICD-10-CM

## 2023-02-27 MED ORDER — ZOLMITRIPTAN 5 MG PO TABS: ORAL_TABLET | ORAL | 0 refills | Status: DC

## 2023-03-02 ENCOUNTER — Other Ambulatory Visit: Payer: Self-pay

## 2023-03-02 ENCOUNTER — Encounter: Payer: Self-pay | Admitting: Family Medicine

## 2023-03-02 ENCOUNTER — Encounter (INDEPENDENT_AMBULATORY_CARE_PROVIDER_SITE_OTHER): Payer: Managed Care, Other (non HMO) | Admitting: Family Medicine

## 2023-03-02 ENCOUNTER — Ambulatory Visit (INDEPENDENT_AMBULATORY_CARE_PROVIDER_SITE_OTHER): Payer: Managed Care, Other (non HMO) | Admitting: Family Medicine

## 2023-03-02 VITALS — BP 135/103 | HR 98 | Resp 18 | Ht 61.42 in | Wt 161.0 lb

## 2023-03-02 DIAGNOSIS — I10 Essential (primary) hypertension: Secondary | ICD-10-CM | POA: Diagnosis not present

## 2023-03-02 DIAGNOSIS — Z1211 Encounter for screening for malignant neoplasm of colon: Secondary | ICD-10-CM | POA: Diagnosis not present

## 2023-03-02 DIAGNOSIS — G43E09 Chronic migraine with aura, not intractable, without status migrainosus: Secondary | ICD-10-CM

## 2023-03-02 DIAGNOSIS — R002 Palpitations: Secondary | ICD-10-CM

## 2023-03-02 DIAGNOSIS — Z1212 Encounter for screening for malignant neoplasm of rectum: Secondary | ICD-10-CM

## 2023-03-02 DIAGNOSIS — G43909 Migraine, unspecified, not intractable, without status migrainosus: Secondary | ICD-10-CM | POA: Diagnosis not present

## 2023-03-02 DIAGNOSIS — E782 Mixed hyperlipidemia: Secondary | ICD-10-CM

## 2023-03-02 DIAGNOSIS — R0602 Shortness of breath: Secondary | ICD-10-CM

## 2023-03-02 DIAGNOSIS — G44209 Tension-type headache, unspecified, not intractable: Secondary | ICD-10-CM

## 2023-03-02 DIAGNOSIS — J301 Allergic rhinitis due to pollen: Secondary | ICD-10-CM

## 2023-03-02 MED ORDER — MONTELUKAST SODIUM 10 MG PO TABS: 10.0000 mg | ORAL_TABLET | Freq: Every day | ORAL | 1 refills | Status: DC

## 2023-03-02 MED ORDER — PROMETHAZINE HCL 25 MG PO TABS: ORAL_TABLET | ORAL | 1 refills | Status: DC

## 2023-03-02 MED ORDER — PROPRANOLOL HCL 40 MG PO TABS: 40.0000 mg | ORAL_TABLET | Freq: Every day | ORAL | 1 refills | Status: DC

## 2023-03-02 MED ORDER — ATORVASTATIN CALCIUM 10 MG PO TABS: 10.0000 mg | ORAL_TABLET | Freq: Every day | ORAL | 1 refills | Status: DC

## 2023-03-02 MED ORDER — LISINOPRIL 40 MG PO TABS: 40.0000 mg | ORAL_TABLET | Freq: Every day | ORAL | 1 refills | Status: DC

## 2023-03-02 MED ORDER — ZOLMITRIPTAN 5 MG PO TABS: ORAL_TABLET | ORAL | 1 refills | Status: DC

## 2023-03-02 NOTE — Progress Notes (Signed)
Established Patient Office Visit  Subjective   Patient ID: Linda Rocha, female    DOB: 25-Sep-1975  Age: 47 y.o. MRN: [id number]  Chief Complaint  Patient presents with   Hypertension   Migraine    HPI Linda Rocha is a 47 y.o. female presenting today for follow up of hypertension, migraines. Hypertension: Pt denies chest pain, SOB, dizziness, edema, syncope, fatigue or heart palpitations. Taking lisinopril 40 mg daily, reports excellent compliance with treatment. Denies side effects. Migraines: Not currently taking prophylactic medication for migraines, gets migraines at least 15 days each month.  Was using zolmitriptan for acute treatment, pharmacy contacted primary care expressing concerns of refill history of 16 tablets each time on 5/4. 5/20. 6/5. 6/17. 6/27. 7/10. 7/22. 7/29. 8/19. 8/27. 9/7.  This is consistent with daily use of zolmitriptan. Zolmitriptan and Phenergan are effective as abortive therapy, she is just using them very frequently  Outpatient Medications Prior to Visit  Medication Sig   ALPRAZolam (XANAX) 1 MG tablet TK 1 T PO TID PRN   amphetamine-dextroamphetamine (ADDERALL) 15 MG tablet Take 15 mg by mouth 3 (three) times daily. Dr Evelene Croon in [place]   amphetamine-dextroamphetamine (ADDERALL) 30 MG tablet Take 1 tablet by mouth 2 (two) times daily.   atorvastatin (LIPITOR) 10 MG tablet Take 1 tablet (10 mg total) by mouth daily.   buPROPion (WELLBUTRIN SR) 150 MG 12 hr tablet Take 150 mg by mouth 2 (two) times daily.   buPROPion ER (WELLBUTRIN SR) 100 MG 12 hr tablet Take 100 mg by mouth daily.   Erenumab-aooe (AIMOVIG) 140 MG/ML SOAJ Inject 140 mg into the skin every 30 (thirty) days.   ergocalciferol (DRISDOL) 1.25 MG (50000 UT) capsule Take 1 capsule (50,000 Units total) by mouth once a week.   ibuprofen (ADVIL) 800 MG tablet TAKE 1 TABLET BY MOUTH THREE TIMES DAILYAS NEEDED  *   lisinopril (ZESTRIL) 40 MG tablet Take 1 tablet (40 mg total) by mouth  daily.   Misc. Devices (ALL-BODY MASSAGE) MISC All-body massage twice monthly prn   montelukast (SINGULAIR) 10 MG tablet Take 1 tablet (10 mg total) by mouth daily.   promethazine (PHENERGAN) 25 MG tablet TAKE 1 TABLET BY MOUTH TWICE DAILY AS NEEDED FOR NAUSEA DUE TO MIGRAINE   tiZANidine (ZANAFLEX) 4 MG tablet TAKE 1/2 TO 1 TABLET BY MOUTH DAILY AS NEEDED FOR MUSCLE PAIN/SPASMS   zolmitriptan (ZOMIG) 5 MG tablet TAKE 1 TABLET BY MOUTH AT THE ONSET OF MIGRAINE. MAY REPEAT IN 2 HOURS. LIMIT USE TO < 10 DAYS PER MONTH   No facility-administered medications prior to visit.    ROS Negative unless otherwise noted in HPI   Objective:     BP (!) 135/103 (BP Location: Right Arm, Patient Position: Sitting, Cuff Size: Normal)   Pulse 98   Resp 18   Ht 5' 1.42" (1.56 m)   Wt 161 lb (73 kg)   SpO2 99%   BMI 30.01 kg/m   Physical Exam Constitutional:      General: She is not in acute distress.    Appearance: Normal appearance.  HENT:     Head: Normocephalic and atraumatic.  Cardiovascular:     Rate and Rhythm: Normal rate and regular rhythm.     Heart sounds: No murmur heard.    No friction rub. No gallop.     Comments: No tachycardia, but rate on the high end of normal Pulmonary:     Effort: Pulmonary effort is normal. No respiratory distress.  Breath sounds: No wheezing, rhonchi or rales.  Skin:    General: Skin is warm and dry.  Neurological:     Mental Status: She is alert and oriented to person, place, and time.      Assessment & Plan:  Essential hypertension Assessment & Plan: BP goal <140/90.  Above goal in office.  Not currently monitoring blood pressure at home, recommend buying blood pressure cuff and keeping blood pressure log for 1 week before sending it to primary care.  If stable at goal, continue lisinopril 40 mg daily.  Likely that adding propranolol for migraine prophylaxis will also be beneficial for keeping blood pressure at goal.   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  Start propranolol daily for migraine prophylaxis.  Continue with Zolmitriptan 5 mg and promethazine 25 mg for abortive therapy limiting use of zolmitriptan to 10 days/month or fewer.  Orders: -     Propranolol HCl; Take 1-2 tablets (40-80 mg total) by mouth daily. Start with 40 mg (1 tablet) daily for 4 weeks, and if still getting migraines more than 10 days each month, increase to 80 mg (2 tablets) daily.  Dispense: 90 tablet; Refill: 1  Screening for colorectal cancer -     Cologuard    Return in about 2 months (around 05/02/2023) for follow-up for migraines, new prophylactic medication, in person or video.    Linda Quitter, PA

## 2023-03-02 NOTE — Patient Instructions (Addendum)
Please check your blood pressure at home for the next week and send a picture of your blood pressure log to me over MyChart.  MIGRAINES: START taking propranolol. Start with 40 mg (1 tablet) daily for 4 weeks, and if still getting migraines more than 10 days each month, increase to 80 mg (2 tablets) daily.

## 2023-03-02 NOTE — Assessment & Plan Note (Addendum)
BP goal <140/90.  Above goal in office.  Not currently monitoring blood pressure at home, recommend buying blood pressure cuff and keeping blood pressure log for 1 week before sending it to primary care.  If stable at goal, continue lisinopril 40 mg daily.  Likely that adding propranolol for migraine prophylaxis will also be beneficial for keeping blood pressure at goal.

## 2023-03-02 NOTE — Assessment & Plan Note (Signed)
In the past, injections have been ineffective for migraine prophylaxis.  Start propranolol daily for migraine prophylaxis.  Continue with Zolmitriptan 5 mg and promethazine 25 mg for abortive therapy limiting use of zolmitriptan to 10 days/month or fewer.

## 2023-03-09 MED ORDER — PROPRANOLOL HCL 40 MG PO TABS: 40.0000 mg | ORAL_TABLET | Freq: Every day | ORAL | 1 refills | Status: DC

## 2023-03-09 NOTE — Addendum Note (Signed)
Addended by: Saralyn Pilar on: 03/09/2023 08:04 AM   Modules accepted: Orders

## 2023-03-10 ENCOUNTER — Other Ambulatory Visit: Payer: Self-pay | Admitting: Family Medicine

## 2023-03-10 DIAGNOSIS — G44209 Tension-type headache, unspecified, not intractable: Secondary | ICD-10-CM

## 2023-03-23 MED ORDER — LISINOPRIL-HYDROCHLOROTHIAZIDE 20-12.5 MG PO TABS: 2.0000 | ORAL_TABLET | Freq: Every day | ORAL | 3 refills | Status: DC

## 2023-03-23 MED ORDER — NURTEC 75 MG PO TBDP: 1.0000 | ORAL_TABLET | ORAL | 11 refills | Status: DC

## 2023-03-23 NOTE — Telephone Encounter (Signed)
Please see the MyChart message reply(ies) for my assessment and plan.    This patient gave consent for this Medical Advice Message and is aware that it may result in a bill to [employer], as well as the possibility of receiving a bill for a co-payment or deductible. They are an established patient, but are not seeking medical advice exclusively about a problem treated during an in person or video visit in the last seven days. I did not recommend an in person or video visit within seven days of my reply.    I spent a total of 20 minutes cumulative time within 7 days through [employer].  Melida Quitter, PA

## 2023-03-23 NOTE — Addendum Note (Signed)
Addended by: Saralyn Pilar on: 03/23/2023 10:11 AM   Modules accepted: Orders

## 2023-03-27 MED ORDER — NURTEC 75 MG PO TBDP: 1.0000 | ORAL_TABLET | ORAL | 11 refills | Status: DC

## 2023-03-27 NOTE — Addendum Note (Signed)
Addended by: Saralyn Pilar on: 03/27/2023 09:43 AM   Modules accepted: Orders

## 2023-04-07 MED ORDER — TIZANIDINE HCL 4 MG PO TABS: 2.0000 mg | ORAL_TABLET | Freq: Every day | ORAL | 0 refills | Status: DC | PRN

## 2023-04-07 MED ORDER — PROMETHAZINE HCL 25 MG PO TABS: ORAL_TABLET | ORAL | 1 refills | Status: DC

## 2023-04-07 NOTE — Addendum Note (Signed)
Addended by: Saralyn Pilar on: 04/07/2023 03:18 PM   Modules accepted: Orders

## 2023-04-07 NOTE — Addendum Note (Signed)
Addended by: Saralyn Pilar on: 04/07/2023 04:23 PM   Modules accepted: Orders

## 2023-04-09 ENCOUNTER — Ambulatory Visit: Payer: Managed Care, Other (non HMO) | Attending: Family Medicine

## 2023-04-09 DIAGNOSIS — R0602 Shortness of breath: Secondary | ICD-10-CM

## 2023-04-09 DIAGNOSIS — R002 Palpitations: Secondary | ICD-10-CM

## 2023-04-09 NOTE — Telephone Encounter (Signed)
Please see the MyChart message reply(ies) for my assessment and plan.    This patient gave consent for this Medical Advice Message and is aware that it may result in a bill to [employer], as well as the possibility of receiving a bill for a co-payment or deductible. They are an established patient, but are not seeking medical advice exclusively about a problem treated during an in person or video visit in the last seven days. I did not recommend an in person or video visit within seven days of my reply.    I spent a total of 30 minutes cumulative time within 7 days through [employer].  Velva Harman, PA

## 2023-04-09 NOTE — Progress Notes (Unsigned)
EP to read

## 2023-04-09 NOTE — Addendum Note (Signed)
Addended by: Saralyn Pilar on: 04/09/2023 08:14 AM   Modules accepted: Orders

## 2023-04-13 MED ORDER — NURTEC 75 MG PO TBDP: 1.0000 | ORAL_TABLET | ORAL | 11 refills | Status: DC

## 2023-04-13 MED ORDER — ZOLMITRIPTAN 5 MG PO TABS: ORAL_TABLET | ORAL | 1 refills | Status: AC

## 2023-04-13 NOTE — Addendum Note (Signed)
Addended by: Tonny Bollman on: 04/13/2023 07:56 AM   Modules accepted: Orders

## 2023-04-16 ENCOUNTER — Ambulatory Visit (HOSPITAL_COMMUNITY): Payer: Managed Care, Other (non HMO) | Attending: Family Medicine

## 2023-04-20 ENCOUNTER — Encounter: Payer: Self-pay | Admitting: Family Medicine

## 2023-04-20 ENCOUNTER — Telehealth: Payer: Self-pay

## 2023-04-20 NOTE — Telephone Encounter (Signed)
Called pt LVM to contact the office °

## 2023-04-20 NOTE — Addendum Note (Signed)
Addended by: Saralyn Pilar on: 04/20/2023 02:09 PM   Modules accepted: Orders

## 2023-04-20 NOTE — Telephone Encounter (Signed)
Copied from CRM [id number]. Topic: Clinical - Medication Question >> Apr 20, 2023 11:31 AM Joanette Gula wrote: Pt wants to be on the same page as Dr. Jairo Ben regarding propranolol (INDERAL) 40 MG tablet  &  lisinopril-hydrochlorothiazide (ZESTORETIC) 20-12.5 MG tablet... Please call her @ [phone].

## 2023-04-20 NOTE — Telephone Encounter (Signed)
This is what she should currently be taking. I will also send her a MyChart message.  FOR BLOOD PRESSURE: -Combination pill of lisinopril-hydrochlorothiazide. 2 tablets once daily. If her blood pressure is less than 100 on top or less than 60 on the bottom, she can cut back to 1 tablet once daily. -AGAIN, SHE CANNOT TAKE MORE THAN 40 MG LISINOPRIL EACH DAY. -She needs to drink at least 64 oz of water each day regardless of which medications she is taking.  FOR MIGRAINES: -Propranolol. She can take 40 mg daily.  This is to PREVENT migraines and to decrease their frequency. -I am not sure which medication she is currently using to get rid of acute migraines.  She should be taking either Nurtec or Zomig.  If she needs further clarification please have her schedule a video visit to discuss.  Otherwise, she should take the above medications EXACTLY as prescribed for the next 2 weeks until her next appointment with me.

## 2023-04-21 NOTE — Telephone Encounter (Signed)
Called pt LVM to contact the office °

## 2023-04-22 DIAGNOSIS — R002 Palpitations: Secondary | ICD-10-CM

## 2023-04-22 DIAGNOSIS — R0602 Shortness of breath: Secondary | ICD-10-CM

## 2023-04-23 NOTE — Telephone Encounter (Signed)
 Called pt LVM stating  that she can look in her [facility] message to see what Lequita Halt has advised her of the recommendation

## 2023-05-05 ENCOUNTER — Telehealth: Payer: Self-pay | Admitting: *Deleted

## 2023-05-05 ENCOUNTER — Encounter: Payer: Managed Care, Other (non HMO) | Admitting: Family Medicine

## 2023-05-05 ENCOUNTER — Encounter: Payer: Self-pay | Admitting: Family Medicine

## 2023-05-05 DIAGNOSIS — I10 Essential (primary) hypertension: Secondary | ICD-10-CM

## 2023-05-05 DIAGNOSIS — G43E09 Chronic migraine with aura, not intractable, without status migrainosus: Secondary | ICD-10-CM

## 2023-05-05 MED ORDER — NURTEC 75 MG PO TBDP: 1.0000 | ORAL_TABLET | ORAL | 0 refills | Status: AC

## 2023-05-05 MED ORDER — LISINOPRIL-HYDROCHLOROTHIAZIDE 20-12.5 MG PO TABS: 1.0000 | ORAL_TABLET | Freq: Every day | ORAL | 0 refills | Status: DC

## 2023-05-05 MED ORDER — PROPRANOLOL HCL 40 MG PO TABS: 40.0000 mg | ORAL_TABLET | Freq: Every day | ORAL | 0 refills | Status: DC

## 2023-05-05 MED ORDER — PROMETHAZINE HCL 25 MG PO TABS: ORAL_TABLET | ORAL | 0 refills | Status: AC

## 2023-05-05 MED ORDER — MONTELUKAST SODIUM 10 MG PO TABS: 10.0000 mg | ORAL_TABLET | Freq: Every day | ORAL | 0 refills | Status: DC

## 2023-05-05 MED ORDER — TIZANIDINE HCL 4 MG PO TABS: 2.0000 mg | ORAL_TABLET | Freq: Every day | ORAL | 0 refills | Status: AC | PRN

## 2023-05-05 MED ORDER — ATORVASTATIN CALCIUM 10 MG PO TABS: 10.0000 mg | ORAL_TABLET | Freq: Every day | ORAL | 0 refills | Status: DC

## 2023-05-05 NOTE — Addendum Note (Signed)
 Addended by: Saralyn Pilar on: 05/05/2023 01:34 PM   Modules accepted: Orders

## 2023-05-05 NOTE — Progress Notes (Signed)
 Virtual Visit via Video Note  I connected with Jenkins Nat Leaven on 05/06/23 at 11:10 AM EST by a video enabled telemedicine application and verified that I am speaking with the correct person using two identifiers.  Patient Location: Home Provider Location: Office/clinic  I discussed the limitations, risks, security, and privacy concerns of performing an evaluation and management service by video and the availability of in person appointments. I also discussed with the patient that there may be a patient responsible charge related to this service. The patient expressed understanding and agreed to proceed.    Subjective   Patient ID: Linda Rocha, female    DOB: March 05, 1976  Age: 48 y.o. MRN: [id number]  Chief Complaint  Patient presents with  . Migraine  . Hypertension  . Error     HPI Linda Rocha is a 48 y.o. female presenting today for follow up of migraines.  At her last appointment, we discussed pharmacy's concern of frequent refills of zolmitriptan  and daily use for abortive therapy.  Started propranolol  daily for migraine prophylaxis because previously she failed amitriptyline, Celexa, Topamax, Emgality , Aimovig  for prophylaxis.  Since that time, blood pressures at home remain elevated so started combination lisinopril -hydrochlorothiazide  2 tablets of 20-12.5 mg for total of 40-25 mg daily and continued propranolol  40 mg daily.  Her blood pressures were dropping very very low, so cut back on the dose to lisinopril -hydrochlorothiazide  1 tablet 420-12.5 mg daily the past several days.  Initiated of Nurtec in the meantime as well, she has been taking these  ROS Negative unless otherwise noted in HPI  Outpatient Medications Prior to Visit  Medication Sig  . RITALIN 20 MG tablet Take 20 mg by mouth in the morning.  SABRA XANAX 1 MG tablet Take 1 mg by mouth at bedtime.  . ibuprofen  (ADVIL ) 800 MG tablet TAKE 1 TABLET BY MOUTH THREE TIMES DAILYAS NEEDED  *  . Misc. Devices  (ALL-BODY MASSAGE) MISC All-body massage twice monthly prn  . WELLBUTRIN SR 150 MG 12 hr tablet Take 150 mg by mouth daily.  . zolmitriptan  (ZOMIG ) 5 MG tablet TAKE 1 TABLET BY MOUTH AT THE ONSET OF MIGRAINE. MAY REPEAT IN 2 HOURS. LIMIT USE TO < 10 DAYS PER MONTH  . [DISCONTINUED] atorvastatin  (LIPITOR) 10 MG tablet Take 1 tablet (10 mg total) by mouth daily.  . [DISCONTINUED] buPROPion ER (WELLBUTRIN SR) 100 MG 12 hr tablet Take 100 mg by mouth daily.  . [DISCONTINUED] lisinopril -hydrochlorothiazide  (ZESTORETIC ) 20-12.5 MG tablet Take 2 tablets by mouth daily.  . [DISCONTINUED] montelukast  (SINGULAIR ) 10 MG tablet Take 1 tablet (10 mg total) by mouth daily.  . [DISCONTINUED] promethazine  (PHENERGAN ) 25 MG tablet TAKE 1 TABLET BY MOUTH TWICE DAILY AS NEEDED FOR NAUSEA DUE TO MIGRAINE  . [DISCONTINUED] propranolol  (INDERAL ) 40 MG tablet Take 1-2 tablets (40-80 mg total) by mouth daily. Start with 40 mg (1 tablet) daily for 4 weeks, and if still getting migraines more than 10 days each month, increase to 80 mg (2 tablets) daily.  . [DISCONTINUED] Rimegepant Sulfate (NURTEC) 75 MG TBDP Take 1 tablet (75 mg total) by mouth every other day.  . [DISCONTINUED] tiZANidine  (ZANAFLEX ) 4 MG tablet Take 0.5-1 tablets (2-4 mg total) by mouth daily as needed for muscle spasms.   No facility-administered medications prior to visit.     Objective:     Physical Exam General: Speaking clearly in complete sentences without any shortness of breath.  Alert and oriented x3.  Normal judgment. No apparent  acute distress.   Assessment & Plan:  Essential hypertension  Chronic migraine with aura without status migrainosus, not intractable  Erroneous encounter - disregard   No follow-ups on file.   I discussed the assessment and treatment plan with the patient. The patient was provided an opportunity to ask questions, and all were answered. The patient agreed with the plan and demonstrated an understanding of  the instructions.   The patient was advised to call back or seek an in-person evaluation if the symptoms worsen or if the condition fails to improve as anticipated.  The above assessment and management plan was discussed with the patient. The patient verbalized understanding of and has agreed to the management plan.   Joesph DELENA Sear, PA This encounter was created in error - please disregard.

## 2023-05-05 NOTE — Telephone Encounter (Signed)
 Contacted pt to go over her questions before beginning her MyChart appointment and started by asking if this was follow up for migraines and she said as well as BP. Went to remove her medications and she said that she had reviewed and added 3 informed her that I only saw the one she requested to be removed and she said that the provider would have to look it up because she was not doing again and then the call was disconnected and I tried to call her back and it went to her VM. Informed provider and tried one more time to connect and she did not answer her phone. I left a VM for her to call office back.

## 2023-05-05 NOTE — Telephone Encounter (Signed)
 Appointment today was originally scheduled to follow-up on migraines, blood pressure has been an added concern since the time that it was scheduled.  When MA called to verify chart information prior to start of video visit, she asked about migraines based on the appointment note made at the time it was scheduled.  Patient told MA that blood pressure was a primary concern and hung up on her.  MA called patient twice and left voicemails, patient did not log onto video visit after that.  Patient states that she plans to change to a different PCP after being frustrated during MyChart communication and video visit.

## 2023-05-20 ENCOUNTER — Ambulatory Visit (HOSPITAL_COMMUNITY): Payer: Managed Care, Other (non HMO)

## 2023-05-28 ENCOUNTER — Encounter: Payer: Self-pay | Admitting: Family Medicine

## 2023-06-09 ENCOUNTER — Encounter (HOSPITAL_COMMUNITY): Payer: Self-pay

## 2023-06-09 ENCOUNTER — Ambulatory Visit (HOSPITAL_COMMUNITY): Payer: Managed Care, Other (non HMO) | Attending: Family Medicine

## 2023-06-10 ENCOUNTER — Other Ambulatory Visit: Payer: Self-pay | Admitting: Family Medicine

## 2023-06-10 DIAGNOSIS — I10 Essential (primary) hypertension: Secondary | ICD-10-CM

## 2023-06-15 ENCOUNTER — Other Ambulatory Visit: Payer: Self-pay | Admitting: Family Medicine

## 2023-06-15 DIAGNOSIS — J301 Allergic rhinitis due to pollen: Secondary | ICD-10-CM

## 2023-06-15 DIAGNOSIS — E782 Mixed hyperlipidemia: Secondary | ICD-10-CM

## 2023-06-26 ENCOUNTER — Other Ambulatory Visit: Payer: Self-pay | Admitting: Family Medicine

## 2023-06-26 DIAGNOSIS — G44209 Tension-type headache, unspecified, not intractable: Secondary | ICD-10-CM

## 2023-07-02 ENCOUNTER — Other Ambulatory Visit: Payer: Self-pay | Admitting: Family Medicine

## 2023-07-02 DIAGNOSIS — I10 Essential (primary) hypertension: Secondary | ICD-10-CM

## 2023-07-02 DIAGNOSIS — G43E09 Chronic migraine with aura, not intractable, without status migrainosus: Secondary | ICD-10-CM

## 2023-08-08 ENCOUNTER — Other Ambulatory Visit: Payer: Self-pay | Admitting: Family Medicine

## 2023-08-08 DIAGNOSIS — I10 Essential (primary) hypertension: Secondary | ICD-10-CM

## 2023-08-08 DIAGNOSIS — G43909 Migraine, unspecified, not intractable, without status migrainosus: Secondary | ICD-10-CM

## 2023-08-14 ENCOUNTER — Other Ambulatory Visit: Payer: Self-pay | Admitting: Family Medicine

## 2023-08-14 DIAGNOSIS — G43E09 Chronic migraine with aura, not intractable, without status migrainosus: Secondary | ICD-10-CM

## 2023-09-17 ENCOUNTER — Other Ambulatory Visit: Payer: Self-pay | Admitting: Family Medicine

## 2023-09-17 DIAGNOSIS — J301 Allergic rhinitis due to pollen: Secondary | ICD-10-CM

## 2023-09-17 DIAGNOSIS — G44209 Tension-type headache, unspecified, not intractable: Secondary | ICD-10-CM

## 2023-09-17 DIAGNOSIS — E782 Mixed hyperlipidemia: Secondary | ICD-10-CM

## 2024-04-09 ENCOUNTER — Other Ambulatory Visit: Payer: Self-pay | Admitting: Family Medicine

## 2024-04-09 DIAGNOSIS — G43909 Migraine, unspecified, not intractable, without status migrainosus: Secondary | ICD-10-CM
